# Patient Record
Sex: Female | Born: 1960 | Race: White | Hispanic: No | Marital: Married | Smoking: Never smoker
Health system: Southern US, Community
[De-identification: ages and names within clinical notes are randomized; demographics above are authoritative.]

## PROBLEM LIST (undated history)

## (undated) DIAGNOSIS — E042 Nontoxic multinodular goiter: Secondary | ICD-10-CM

## (undated) DIAGNOSIS — E039 Hypothyroidism, unspecified: Secondary | ICD-10-CM

## (undated) DIAGNOSIS — M8580 Other specified disorders of bone density and structure, unspecified site: Secondary | ICD-10-CM

## (undated) DIAGNOSIS — C44310 Basal cell carcinoma of skin of unspecified parts of face: Secondary | ICD-10-CM

## (undated) DIAGNOSIS — F411 Generalized anxiety disorder: Secondary | ICD-10-CM

## (undated) HISTORY — PX: MOHS SURGERY: SHX181

## (undated) HISTORY — DX: Generalized anxiety disorder: F41.1

## (undated) HISTORY — DX: Basal cell carcinoma of skin of unspecified parts of face: C44.310

## (undated) HISTORY — PX: SKIN SURGERY: SHX2413

## (undated) HISTORY — DX: Other specified disorders of bone density and structure, unspecified site: M85.80

## (undated) HISTORY — DX: Nontoxic multinodular goiter: E04.2

## (undated) HISTORY — DX: Hypothyroidism, unspecified: E03.9

---

## 1998-08-09 ENCOUNTER — Other Ambulatory Visit: Admission: RE | Admit: 1998-08-09 | Discharge: 1998-08-09 | Payer: Self-pay | Admitting: *Deleted

## 1999-06-25 ENCOUNTER — Other Ambulatory Visit: Admission: RE | Admit: 1999-06-25 | Discharge: 1999-06-25 | Payer: Self-pay | Admitting: Obstetrics & Gynecology

## 1999-12-22 ENCOUNTER — Inpatient Hospital Stay (HOSPITAL_COMMUNITY): Admission: AD | Admit: 1999-12-22 | Discharge: 1999-12-24 | Payer: Self-pay | Admitting: Obstetrics & Gynecology

## 1999-12-27 ENCOUNTER — Encounter: Admission: RE | Admit: 1999-12-27 | Discharge: 2000-03-26 | Payer: Self-pay | Admitting: Obstetrics & Gynecology

## 2000-02-12 ENCOUNTER — Other Ambulatory Visit: Admission: RE | Admit: 2000-02-12 | Discharge: 2000-02-12 | Payer: Self-pay | Admitting: Obstetrics & Gynecology

## 2000-03-28 ENCOUNTER — Encounter: Admission: RE | Admit: 2000-03-28 | Discharge: 2000-04-01 | Payer: Self-pay | Admitting: Obstetrics & Gynecology

## 2001-02-15 ENCOUNTER — Other Ambulatory Visit: Admission: RE | Admit: 2001-02-15 | Discharge: 2001-02-15 | Payer: Self-pay | Admitting: Obstetrics & Gynecology

## 2002-03-30 ENCOUNTER — Other Ambulatory Visit: Admission: RE | Admit: 2002-03-30 | Discharge: 2002-03-30 | Payer: Self-pay | Admitting: Obstetrics & Gynecology

## 2003-04-09 ENCOUNTER — Other Ambulatory Visit: Admission: RE | Admit: 2003-04-09 | Discharge: 2003-04-09 | Payer: Self-pay | Admitting: Obstetrics & Gynecology

## 2003-04-13 ENCOUNTER — Encounter: Admission: RE | Admit: 2003-04-13 | Discharge: 2003-04-13 | Payer: Self-pay | Admitting: Family Medicine

## 2003-05-31 ENCOUNTER — Ambulatory Visit (HOSPITAL_COMMUNITY): Admission: RE | Admit: 2003-05-31 | Discharge: 2003-05-31 | Payer: Self-pay | Admitting: Endocrinology

## 2003-05-31 ENCOUNTER — Encounter (INDEPENDENT_AMBULATORY_CARE_PROVIDER_SITE_OTHER): Payer: Self-pay | Admitting: *Deleted

## 2004-04-07 ENCOUNTER — Encounter: Admission: RE | Admit: 2004-04-07 | Discharge: 2004-04-07 | Payer: Self-pay | Admitting: Endocrinology

## 2004-04-30 ENCOUNTER — Other Ambulatory Visit: Admission: RE | Admit: 2004-04-30 | Discharge: 2004-04-30 | Payer: Self-pay | Admitting: Obstetrics & Gynecology

## 2005-05-07 ENCOUNTER — Other Ambulatory Visit: Admission: RE | Admit: 2005-05-07 | Discharge: 2005-05-07 | Payer: Self-pay | Admitting: Obstetrics & Gynecology

## 2005-07-21 ENCOUNTER — Encounter: Admission: RE | Admit: 2005-07-21 | Discharge: 2005-07-21 | Payer: Self-pay | Admitting: Family Medicine

## 2008-05-30 ENCOUNTER — Encounter: Admission: RE | Admit: 2008-05-30 | Discharge: 2008-05-30 | Payer: Self-pay | Admitting: Endocrinology

## 2008-07-12 ENCOUNTER — Other Ambulatory Visit: Admission: RE | Admit: 2008-07-12 | Discharge: 2008-07-12 | Payer: Self-pay | Admitting: Interventional Radiology

## 2008-07-12 ENCOUNTER — Encounter: Admission: RE | Admit: 2008-07-12 | Discharge: 2008-07-12 | Payer: Self-pay | Admitting: Endocrinology

## 2008-07-12 ENCOUNTER — Encounter (INDEPENDENT_AMBULATORY_CARE_PROVIDER_SITE_OTHER): Payer: Self-pay | Admitting: Interventional Radiology

## 2010-04-28 LAB — HM COLONOSCOPY

## 2010-06-29 ENCOUNTER — Encounter: Payer: Self-pay | Admitting: Endocrinology

## 2010-11-12 ENCOUNTER — Other Ambulatory Visit: Payer: Self-pay | Admitting: Endocrinology

## 2010-11-12 DIAGNOSIS — E049 Nontoxic goiter, unspecified: Secondary | ICD-10-CM

## 2010-12-02 ENCOUNTER — Ambulatory Visit
Admission: RE | Admit: 2010-12-02 | Discharge: 2010-12-02 | Disposition: A | Discharge status: Home / Self Care | Payer: BC Managed Care – PPO | Source: Ambulatory Visit | Attending: Endocrinology | Admitting: Endocrinology

## 2010-12-02 DIAGNOSIS — E049 Nontoxic goiter, unspecified: Secondary | ICD-10-CM

## 2011-09-07 ENCOUNTER — Ambulatory Visit (INDEPENDENT_AMBULATORY_CARE_PROVIDER_SITE_OTHER): Payer: BC Managed Care – PPO | Admitting: Family Medicine

## 2011-09-07 ENCOUNTER — Encounter: Payer: Self-pay | Admitting: Family Medicine

## 2011-09-07 VITALS — BP 100/62 | HR 68 | Ht 65.0 in | Wt 134.0 lb

## 2011-09-07 DIAGNOSIS — M545 Low back pain, unspecified: Secondary | ICD-10-CM

## 2011-09-07 DIAGNOSIS — F411 Generalized anxiety disorder: Secondary | ICD-10-CM

## 2011-09-07 LAB — POCT URINALYSIS DIPSTICK
Bilirubin, UA: NEGATIVE
Blood, UA: NEGATIVE
Glucose, UA: NEGATIVE
Ketones, UA: NEGATIVE
Leukocytes, UA: NEGATIVE
Nitrite, UA: NEGATIVE
Protein, UA: NEGATIVE
Spec Grav, UA: <=1.005
Urobilinogen, UA: NEGATIVE
pH, UA: 8

## 2011-09-07 MED ORDER — NAPROXEN 500 MG PO TABS: 500.0000 mg | ORAL_TABLET | Freq: Two times a day (BID) | ORAL | Status: AC

## 2011-09-07 NOTE — Progress Notes (Signed)
Chief complaint:  Low back pain x 3 months. Lower back pain when she turns a certain way. UA neg.   HPI:  Started boot camp last May, going regularly 2x/week.  Sometimes her back seems irritated more after doing the boot camp.  Pain is at her R lower back.  Has pain when she rolls over in bed at night, or when she twists to the right.  Described as an irritating pain.  Has more pain when she's really tired at the end of the day, achey.  Denies any radiation of pain, denies numbness/ or tingling.  Denies any pain in spine.  Tried Motrin and Aleve, some improvement, only took sporadically. No history of previous back injury or problems.  Anxiety--has been on the citalopram for years, and hasn't had any problems.    Past Medical History  Diagnosis Date  . Hypothyroidism     followed by Dr. Talmage Nap  . Generalized anxiety disorder     History reviewed. No pertinent past surgical history.  History   Social History  . Marital Status: Married    Spouse Name: N/A    Number of Children: 1  . Years of Education: N/A   Occupational History  . teacher [employer]   Social History Main Topics  . Smoking status: Never Smoker   . Smokeless tobacco: Never Used  . Alcohol Use: Yes     0-1 per week.  . Drug Use: Not on file  . Sexually Active: Not on file   Other Topics Concern  . 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 Lives with her husband and son; dog 2 cats    Family History  Problem Relation Age of Onset  . Hypothyroidism Mother   . Arthritis Mother   . Hyperlipidemia Father   . Arthritis Sister   . Cancer Maternal Grandmother     breast cancer in 50's  . Cancer Maternal Grandfather     leukemia, diagnosed in 38's  . Diabetes Maternal Grandfather   . Heart disease Paternal Grandfather     37's    Current outpatient prescriptions:citalopram (CELEXA) 20 MG tablet, , Disp: , Rfl: ;  GIANVI 3-0.02 MG tablet, , Disp: ,  Rfl: ;  levothyroxine (SYNTHROID, LEVOTHROID) 100 MCG tablet, , Disp: , Rfl: ;  naproxen (NAPROSYN) 500 MG tablet, Take 1 tablet (500 mg total) by mouth 2 (two) times daily with a meal. Take as needed for pain, Disp: 30 tablet, Rfl: 0  Allergies  Allergen Reactions  . Codeine Other (See Comments)    Heart racing/palpitations.   ROS:  Denies fevers, URI symptoms, urinary complaints, GI complaints, skin rashes, chest pain, palpitations, edema, numbness, tingling, weakness, other joint pains or other concerns.  Fingerstick cholesterol at a screen (nonfasting) was 215.  Has lipids checked by GYN and they were reportedly normal.  PHYSICAL EXAM: BP 100/62  Pulse 68  Ht 5\' 5"  (1.651 m)  Wt 134 lb (60.782 kg)  BMI 22.30 kg/m2  LMP 08/24/2011 Well developed, pleasant female in no distress Neck: no lymphadenopathy or thyromegaly Heart: regular rate and rhythm Lungs: clear Back: no spine or CVA tenderness.  Tender at R SI joint.  No hypermobility.  No muscle spasm.  Negative straight leg raise Neuro: alert and oriented.  Cranial nerves grossly intact.  DTR's 2+ and symmetric in upper and lower extremities.  Normal sensation and strength Skin: no rash Psych: normal mood, affect, hygiene and  grooming  ASSESSMENT/PLAN: 1. LBP (low back pain)  POCT Urinalysis Dipstick, naproxen (NAPROSYN) 500 MG tablet  2. Anxiety state, unspecified     Pain at R SI joint, but no significant dysfunction or muscle spasm noted. Naprosyn, heat, stretches.  Consider chiro if not improving (or PT).  Anxiety--consider trial of cutting dose of celexa to 1/2 tablet daily for a month or two, and if still doing well, can taper off completely.  If recurrent symptoms, can increase back to full tablet, and continue to take longterm.

## 2011-09-07 NOTE — Patient Instructions (Signed)
Heat, stretches. Take anti-inflammatory twice daily with food until pain is gone completely.  If you take the full 2 weeks, and continue to have pain, consider physical therapy or chiropractor (I see Dr. Thereasa Distance at [facility] on [facility], or other chiropractor who was Active Release Technique (ART))

## 2011-09-23 ENCOUNTER — Encounter: Payer: Self-pay | Admitting: Family Medicine

## 2011-09-24 ENCOUNTER — Encounter: Payer: Self-pay | Admitting: Family Medicine

## 2011-09-24 ENCOUNTER — Ambulatory Visit (INDEPENDENT_AMBULATORY_CARE_PROVIDER_SITE_OTHER): Payer: BC Managed Care – PPO | Admitting: Family Medicine

## 2011-09-24 VITALS — BP 94/60 | HR 64 | Temp 98.5°F | Ht 65.0 in | Wt 131.0 lb

## 2011-09-24 DIAGNOSIS — R197 Diarrhea, unspecified: Secondary | ICD-10-CM

## 2011-09-24 NOTE — Patient Instructions (Signed)
Drink plenty of fluids (non-dairy).   B.R.A.T. Diet Your doctor has recommended the B.R.A.T. diet for you or your child until the condition improves. This is often used to help control diarrhea and vomiting symptoms. If you or your child can tolerate clear liquids, you may have:  Bananas.   Rice.   Applesauce.   Toast (and other simple starches such as crackers, potatoes, noodles).  Be sure to avoid dairy products, meats, and fatty foods until symptoms are better. Fruit juices such as apple, grape, and prune juice can make diarrhea worse. Avoid these. Continue this diet for 2 days or as instructed by your caregiver. Document Released: 05/25/2005 Document Revised: 05/14/2011 Document Reviewed: 11/11/2006 [facility] Patient Information 2012 [place], [place].  You can advance your diet to a bland diet, if stools are improving and feeling better.  Avoid dairy for about a week, allowing your body time to regenerate the lactase enzyme to help digest dairy products.  You can continue to use Pepto Bismol if helping (will cause stools to turn black) versus Imodium.  Probiotics (ie Align, or Activia) can help reset the normal colonic flora.  Return immediately if having high fevers, severe abdominal pain, bloody stools, or persistent black bowel movement when you have not taken Pepto.    Avoid anti-inflammatories for now--use tylenol instead.

## 2011-09-24 NOTE — Progress Notes (Signed)
Chief complaint: diarrhea since Tues morning. Tues 2x in the am and once in the afternoon. Wed,Thurs only in the am but nauseous thoroughout the day. Stools were black. She has not attempted to eat anything today  HPI: 2 days ago, in the morning had gurgling stomach followed by 2 episodes of completely watery stool, brown.  Took Pepto Bismol later that night, helped some, and the following day, as well as today, stools were black.  Stools are still very watery, but a little more formed than the first day.  Cramping right before going to bathroom, otherwise no abdominal pain. Had nausea for the first 2 days, a little better today.  Denies fevers, blood in stool.  Denies sick contacts at home, some ill contacts at school.  Denies eating any spoiled or undercooked foods, eating same foods as others who are not ill, no travel, no cramping, no recent antibiotic use.  She was seen 4/1 with LBP, and rx'd naproxen.  Took it for 10 days.  Took it with food.  Seemed to have some indigestion with taking it (even with food), so stopped it after 10 days, and indigestion resolved. Back pain isn't back to normal, but is improved.  Pain has moved more to lateral/anterior hip now.  Past Medical History  Diagnosis Date  . Hypothyroidism     followed by Dr. Talmage Nap  . Generalized anxiety disorder   . Multinodular goiter     Dr. Talmage Nap    No past surgical history on file.  History   Social History  . Marital Status: Married    Spouse Name: N/A    Number of Children: 1  . Years of Education: N/A   Occupational History  . teacher [employer]   Social History Main Topics  . Smoking status: Never Smoker   . Smokeless tobacco: Never Used  . Alcohol Use: Yes     0-1 per week.  . Drug Use: Not on file  . Sexually Active: Not on file   Other Topics Concern  . 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 Hypothyroidism Mother   . Arthritis Mother   . Hyperlipidemia Father   . Arthritis Sister   . Cancer Maternal Grandmother     breast cancer in 67's  . Cancer Maternal Grandfather     leukemia, diagnosed in 20's  . Diabetes Maternal Grandfather   . Heart disease Paternal Grandfather     4's    Current outpatient prescriptions:citalopram (CELEXA) 20 MG tablet, , Disp: , Rfl: ;  GIANVI 3-0.02 MG tablet, , Disp: , Rfl: ;  levothyroxine (SYNTHROID, LEVOTHROID) 100 MCG tablet, , Disp: , Rfl: ;  naproxen (NAPROSYN) 500 MG tablet, Take 1 tablet (500 mg total) by mouth 2 (two) times daily with a meal. Take as needed for pain, Disp: 30 tablet, Rfl: 0  Allergies  Allergen Reactions  . Codeine Other (See Comments)    Heart racing/palpitations.   ROS:  Denies fevers, headaches, dizziness, abdominal pain (see HPI), hematochezia, urinary complaints or other problems  PHYSICAL EXAM: BP 94/60  Pulse 64  Temp(Src) 98.5 F (36.9 C) (Oral)  Ht 5\' 5"  (1.651 m)  Wt 131 lb (59.421 kg)  BMI 21.80 kg/m2  LMP 08/24/2011 Well developed, pleasant female in no distress Heart: regular rate and rhythm Lungs:  clear Abdomen: hyperactive bowel sounds, nontender Extremities: no edema Back: no spinal tenderness, very mild discomfort at R SI joint  Declines rectal exam/hemoccult today.  ASSESSMENT/PLAN: 1. Diarrhea   Most likely viral etiology given history.  Reassured that stools are likely black from pepto bismol rather than from bleeding ulcer, given her history.  (she refused rectal exam to reassure her further with a negative hemoccult).  Discussed BRAT diet, avoiding dairy, drinking plenty of fluids. Return if increasing pain, fever, ongoing diarrhea, bloody stools, abdominal pain, or other concerns  Avoid NSAIDs for now

## 2012-04-26 ENCOUNTER — Other Ambulatory Visit: Payer: Self-pay | Admitting: Endocrinology

## 2012-04-26 DIAGNOSIS — E049 Nontoxic goiter, unspecified: Secondary | ICD-10-CM

## 2012-05-12 ENCOUNTER — Other Ambulatory Visit: Payer: BC Managed Care – PPO

## 2012-05-13 ENCOUNTER — Ambulatory Visit
Admission: RE | Admit: 2012-05-13 | Discharge: 2012-05-13 | Disposition: A | Discharge status: Home / Self Care | Payer: BC Managed Care – PPO | Source: Ambulatory Visit | Attending: Endocrinology | Admitting: Endocrinology

## 2012-05-13 DIAGNOSIS — E049 Nontoxic goiter, unspecified: Secondary | ICD-10-CM

## 2012-06-10 ENCOUNTER — Encounter: Payer: Self-pay | Admitting: Medical

## 2012-06-10 ENCOUNTER — Ambulatory Visit (INDEPENDENT_AMBULATORY_CARE_PROVIDER_SITE_OTHER): Payer: BC Managed Care – PPO | Admitting: Medical

## 2012-06-10 VITALS — BP 110/70 | HR 62 | Temp 98.3°F | Resp 16 | Wt 145.0 lb

## 2012-06-10 DIAGNOSIS — H9201 Otalgia, right ear: Secondary | ICD-10-CM

## 2012-06-10 DIAGNOSIS — H9209 Otalgia, unspecified ear: Secondary | ICD-10-CM

## 2012-06-10 DIAGNOSIS — H6980 Other specified disorders of Eustachian tube, unspecified ear: Secondary | ICD-10-CM

## 2012-06-10 MED ORDER — MOMETASONE FUROATE 50 MCG/ACT NA SUSP: 2.0000 | Freq: Every day | NASAL | Status: DC

## 2012-06-10 NOTE — Progress Notes (Signed)
Subjective: Here for right ear c/o.  Having some right ear pressure, dull ache, headache x 1 week.  Some head congestion, but denies fever, runny nose, sneezing, cough, nausea, vomiting, dizziness.  No recent flying, diving, change in elevation.   Using nothing for symptoms.  No other aggravating or relieving factors.  No other symptoms or c/o.  Past Medical History  Diagnosis Date  . Hypothyroidism     followed by Dr. Talmage Nap  . Generalized anxiety disorder   . Multinodular goiter     Dr. Talmage Nap   Review of Systems Constitutional: -fever, -chills, -sweats, -fatigue ENT: -runny nose,  -sore throat Respiratory: -cough, -shortness of breath, -wheezing Gastroenterology: -abdominal pain, -nausea, -vomiting, -diarrhea Ophthalmology: -vision changes Neurology: -weakness, -tingling, -numbness     Objective:   Physical Exam  Filed Vitals:   06/10/12 0905  BP: 110/70  Pulse: 62  Temp: 98.3 F (36.8 C)  Resp: 16    General appearance: alert, no distress, WD/WN HEENT: normocephalic, conjunctiva normal, slightly decreased light reflex, but otherwise normal appearing TMs, nares patent, no discharge or erythema, pharynx normal Oral cavity: MMM, no lesions Neck: supple, no lymphadenopathy, no thyromegaly, no masses Lungs: CTA bilaterally, no wheezes, rhonchi, or rales Neuro: CN2-12 intact, gross hearing normal   Assessment and Plan :    Encounter Diagnoses  Name Primary?  . Eustachian tube dysfunction Yes  . Otalgia of right ear    Advised that most likely etiology is eustachian tube dysfunction.   Advised 1-2 wk trial of Nasonex samples 1 spray per nostril BID, increased water intake, and/or Zyrtec QHS.  If not improved or worse in the next 2 wk, then recheck.

## 2012-06-10 NOTE — Patient Instructions (Signed)
Increase water intake.  Begin nasonex nasal spray, 1 spray per nostril morning and night time for a week or 2.  Also consider Zyrtec at bedtime for the same period of time.   If this doesn't resolve in 2 weeks, then recheck.   Barotitis Media Barotitis media is soreness (inflammation) of the area behind the eardrum (middle ear). This occurs when the auditory tube (Eustachian tube) leading from the back of the throat to the eardrum is blocked. When it is blocked air cannot move in and out of the middle ear to equalize pressure changes. These pressure changes come from changes in altitude when:  Flying.   Driving in the mountains.   Diving.  Problems are more likely to occur with pressure changes during times when you are congested as from:  Hay fever.   Upper respiratory infection.   A cold.  Damage or hearing loss (barotrauma) caused by this may be permanent. HOME CARE INSTRUCTIONS   Use medicines as recommended by your caregiver. Over the counter medicines will help unblock the canal and can help during times of air travel.   Do not put anything into your ears to clean or unplug them. Eardrops will not be helpful.   Do not swim, dive, or fly until your caregiver says it is all right to do so. If these activities are necessary, chewing gum with frequent swallowing may help. It is also helpful to hold your nose and gently blow to pop your ears for equalizing pressure changes. This forces air into the Eustachian tube.   For little ones with problems, give your baby a bottle of water or juice during periods when pressure changes would be anticipated such as during take offs and landings associated with air travel.   Only take over-the-counter or prescription medicines for pain, discomfort, or fever as directed by your caregiver.   A decongestant may be helpful in de-congesting the middle ear and make pressure equalization easier. This can be even more effective if the drops (spray) are  delivered with the head lying over the edge of a bed with the head tilted toward the ear on the affected side.   If your caregiver has given you a follow-up appointment, it is very important to keep that appointment. Not keeping the appointment could result in a chronic or permanent injury, pain, hearing loss and disability. If there is any problem keeping the appointment, you must call back to this facility for assistance.  SEEK IMMEDIATE MEDICAL CARE IF:   You develop a severe headache, dizziness, severe ear pain, or bloody or pus-like drainage from your ears.   An oral temperature above 102 F (38.9 C) develops.   Your problems do not improve or become worse.  MAKE SURE YOU:   Understand these instructions.   Will watch your condition.   Will get help right away if you are not doing well or get worse.  Document Released: 05/22/2000 Document Revised: 02/04/2011 Document Reviewed: 12/29/2007 [facility] Patient Information 2012 [place], [place].

## 2013-07-04 ENCOUNTER — Other Ambulatory Visit: Payer: Self-pay | Admitting: Endocrinology

## 2013-07-04 DIAGNOSIS — E039 Hypothyroidism, unspecified: Secondary | ICD-10-CM

## 2013-07-31 ENCOUNTER — Other Ambulatory Visit: Payer: BC Managed Care – PPO

## 2013-08-02 ENCOUNTER — Ambulatory Visit
Admission: RE | Admit: 2013-08-02 | Discharge: 2013-08-02 | Disposition: A | Discharge status: Home / Self Care | Payer: BC Managed Care – PPO | Source: Ambulatory Visit | Attending: Endocrinology | Admitting: Endocrinology

## 2013-08-02 DIAGNOSIS — E039 Hypothyroidism, unspecified: Secondary | ICD-10-CM

## 2013-08-16 ENCOUNTER — Ambulatory Visit: Payer: Self-pay | Admitting: Family Medicine

## 2014-02-15 ENCOUNTER — Other Ambulatory Visit: Payer: Self-pay | Admitting: Endocrinology

## 2014-02-15 DIAGNOSIS — E049 Nontoxic goiter, unspecified: Secondary | ICD-10-CM

## 2014-02-28 ENCOUNTER — Other Ambulatory Visit: Payer: BC Managed Care – PPO

## 2014-07-19 ENCOUNTER — Other Ambulatory Visit: Payer: Self-pay | Admitting: Orthopedic Surgery

## 2014-07-19 DIAGNOSIS — M25561 Pain in right knee: Secondary | ICD-10-CM

## 2014-07-26 ENCOUNTER — Ambulatory Visit
Admission: RE | Admit: 2014-07-26 | Discharge: 2014-07-26 | Disposition: A | Discharge status: Home / Self Care | Payer: BC Managed Care – PPO | Source: Ambulatory Visit | Attending: Orthopedic Surgery | Admitting: Orthopedic Surgery

## 2014-07-26 DIAGNOSIS — M25561 Pain in right knee: Secondary | ICD-10-CM

## 2014-08-22 ENCOUNTER — Other Ambulatory Visit: Payer: Self-pay | Admitting: Endocrinology

## 2014-08-22 DIAGNOSIS — E049 Nontoxic goiter, unspecified: Secondary | ICD-10-CM

## 2014-08-28 ENCOUNTER — Ambulatory Visit
Admission: RE | Admit: 2014-08-28 | Discharge: 2014-08-28 | Disposition: A | Discharge status: Home / Self Care | Payer: BC Managed Care – PPO | Source: Ambulatory Visit | Attending: Endocrinology | Admitting: Endocrinology

## 2014-08-28 DIAGNOSIS — E049 Nontoxic goiter, unspecified: Secondary | ICD-10-CM

## 2014-12-17 ENCOUNTER — Other Ambulatory Visit: Payer: Self-pay | Admitting: Obstetrics & Gynecology

## 2014-12-18 LAB — CYTOLOGY - PAP

## 2015-09-07 DIAGNOSIS — C44310 Basal cell carcinoma of skin of unspecified parts of face: Secondary | ICD-10-CM

## 2015-09-07 HISTORY — DX: Basal cell carcinoma of skin of unspecified parts of face: C44.310

## 2016-02-12 ENCOUNTER — Encounter: Payer: Self-pay | Admitting: Family Medicine

## 2016-02-12 ENCOUNTER — Ambulatory Visit (INDEPENDENT_AMBULATORY_CARE_PROVIDER_SITE_OTHER): Payer: BC Managed Care – PPO | Admitting: Family Medicine

## 2016-02-12 VITALS — BP 100/58 | HR 80 | Temp 97.6°F | Ht 65.0 in | Wt 138.6 lb

## 2016-02-12 DIAGNOSIS — J309 Allergic rhinitis, unspecified: Secondary | ICD-10-CM | POA: Diagnosis not present

## 2016-02-12 MED ORDER — FLUTICASONE PROPIONATE 50 MCG/ACT NA SUSP: 2.0000 | Freq: Every day | NASAL | 6 refills | Status: DC

## 2016-02-12 NOTE — Patient Instructions (Signed)
  Drink plenty of water to keep secretions thin. Restart Flonase (2 sprays each nostril daily, gentle sniffs only, and may be decreased to just 1 spray per day for maintenance if/when doing well). You can also use an antihistamine such as claritin or allegra or zyrtec once daily while you are waiting for the flonase to be effective. Consider doing sinus rinses as needed for sinus pain. Continue the decongestants as needed. Consider adding guaifenesin (ie Mucinex) if secretions are very thick.  Contact us if your secretions turn yellow-green, if you develop fevers, or other new symptoms. If you pick up a cold from the students, you may see this discoloration--continue over-the-counter supportive measures, and contact us if it isn't improving after 5-7 days.

## 2016-02-12 NOTE — Progress Notes (Signed)
Chief Complaint  Patient presents with  . Sinusitis    sinus headache, not really sinus pain. The headaches are sinus relates. Does not have any discolored mucus. 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     She is complaining of headaches x several months that she feels are sinus related. Pain is in her forehead, above her eyes, and sometimes it feels like a vice squeezing, which she realizes may be stress/muscular.  She has developed increased sinus pressure since school started (2 weeks). She had mold in her classroom, which has now been resolved.  No fevers, chills. This week her nose started running, but hasn't really had a lot of mucus or congestion.  Denies sore throat, postnasal drainage, cough.  Tried an OTC decongestant, 12-hour taking in the mornings, which helps some, but headache recurs in the afternoon (prior to 12 hours). Sometimes she will take another when she gets home, which helps, but causes some insomnia.  Previously used Flonase with good results.  Uses it seasonally, not in a while.  PMH, PSH, SH reviewed  Outpatient Encounter Prescriptions as of 02/12/2016  Medication Sig  . citalopram (CELEXA) 20 MG tablet   . levothyroxine (SYNTHROID, LEVOTHROID) 100 MCG tablet   . acetaminophen (TYLENOL) 325 MG tablet Take 650 mg by mouth every 6 (six) hours as needed.  . fluticasone (FLONASE) 50 MCG/ACT nasal spray Place 2 sprays into both nostrils daily.  . [DISCONTINUED] GIANVI 3-0.02 MG tablet   . [DISCONTINUED] mometasone (NASONEX) 50 MCG/ACT nasal spray Place 2 sprays into the nose daily.   No facility-administered encounter medications on file as of 02/12/2016.    Allergies  Allergen Reactions  . Codeine Other (See Comments)    Heart racing/palpitations.   ROS:  No fever, chills, myalgias, dizziness, ear pain, sore throat, cough, shortness of breath, chest pain, bleeding, bruising, rash, hearing/vision change, numbness, tingling or other concerns. See  HPI.  PHYSICAL EXAM:  BP (!) 100/58 (BP Location: Left Arm, Patient Position: Sitting, Cuff Size: Normal)   Pulse 80   Temp 97.6 F (36.4 C) (Tympanic)   Ht 5\' 5"  (1.651 m)   Wt 138 lb 9.6 oz (62.9 kg)   BMI 23.06 kg/m   Well developed, well-appearing female in no distress HEENT: PERRL, EOMI, conjunctiva and sclera are clear. Nasal mucosa was mild-mod edematous L>R, without erythema or purulence. Sinuses nontender OP clear Neck: no lymphadenopathy or mass Heart: regular rate and rhythm without murmur Lungs: clear bilaterally Skin: normal turgor, no rash Neuro: alert and oriented, cranial nerves intact, normal gait Psych: normal mood, affect, hygiene and grooming  ASSESSMENT/PLAN:  Allergic rhinitis, unspecified allergic rhinitis type - no evidence of bacterial infection. Supportive measures reviewed.  Restart flonase, continue decongestants prn, antihistamines prn - Plan: fluticasone (FLONASE) 50 MCG/ACT nasal spray   Drink plenty of water to keep secretions thin. Restart Flonase (2 sprays each nostril daily, gentle sniffs only, and may be decreased to just 1 spray per day for maintenance if/when doing well). You can also use an antihistamine such as claritin or allegra or zyrtec once daily while you are waiting for the flonase to be effective. Consider doing sinus rinses as needed for sinus pain. Continue the decongestants as needed. Consider adding guaifenesin (ie Mucinex) if secretions are very thick.  Contact us if your secretions turn yellow-green, if you develop fevers, or other new symptoms. 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

## 2016-06-04 ENCOUNTER — Other Ambulatory Visit: Payer: Self-pay | Admitting: Endocrinology

## 2016-06-04 DIAGNOSIS — E049 Nontoxic goiter, unspecified: Secondary | ICD-10-CM

## 2016-07-27 ENCOUNTER — Encounter: Payer: Self-pay | Admitting: Family Medicine

## 2016-07-27 ENCOUNTER — Ambulatory Visit (INDEPENDENT_AMBULATORY_CARE_PROVIDER_SITE_OTHER): Payer: BC Managed Care – PPO | Admitting: Family Medicine

## 2016-07-27 VITALS — BP 120/70 | HR 68 | Ht 65.0 in | Wt 145.0 lb

## 2016-07-27 DIAGNOSIS — R519 Headache, unspecified: Secondary | ICD-10-CM

## 2016-07-27 DIAGNOSIS — G4482 Headache associated with sexual activity: Secondary | ICD-10-CM | POA: Diagnosis not present

## 2016-07-27 DIAGNOSIS — R51 Headache: Secondary | ICD-10-CM | POA: Diagnosis not present

## 2016-07-27 NOTE — Progress Notes (Signed)
Chief Complaint  Patient presents with  . Migraine    having really bad headaches (on the sides of her head), ears ringing. More often than usual. Almost everyday off and on. Sometimes to the point of making her sick to her stomach. Feels like her heart is racing at night when she sits down on the couch.    Currently has a frontal headache, and has noted some slight ringing in her ears.  Admits she hasn't been using nasal spray daily.  She was last seen in September with sinus headaches.  She would still have some intermittent headaches, but better when using flonase and sudafed.  2 weeks she woke up with a severe headache at both temples, associated with some nausea.  She has been having frequent headaches since then--not daily, but more often than usual. Most of her headaches have been at her temples bilaterally.  She made the appointment today after an incident 2 days ago-- She reports that during an intimate moment on Saturday (during orgasm)--she felt like her head was being stabbed with knives. She describes it as the worst headache of her life.  It eased off after orgasm, but had some residual headache. It ultimately completely resolved.  She had no associated numbness, tingling, weakness or other neurologic symptoms, just the headache.  No known teeth clenching or grinding (husband/dentist haven't mentioned).  Chews some gum, not daily. Some stress related to 16yo son Denies snoring/apnea.  She is postmenopausal. Sees Dr. Nori Riis, who she says is encouraging her to get on hormones. She is hesitant, and would like my opinion.  She denies any hot flashes, night sweats, insomnia, vaginal dryness/pain with intercourse or other significant menopausal symptoms.  Citalopram is effective in treating anxiety. She thinks she may have missed taking the citalopram over the weekend (?really wasn't sure, possibly could have forgotten)  PMH, PSH, SH reviewed  Outpatient Encounter Prescriptions as of  07/27/2016  Medication Sig  . citalopram (CELEXA) 20 MG tablet Take 20 mg by mouth daily.   . fluticasone (FLONASE) 50 MCG/ACT nasal spray Place 2 sprays into both nostrils daily.  Marland Kitchen levothyroxine (SYNTHROID, LEVOTHROID) 100 MCG tablet Take 100 mcg by mouth daily before breakfast.   . acetaminophen (TYLENOL) 325 MG tablet Take 650 mg by mouth every 6 (six) hours as needed.  . phenylephrine (SUDAFED PE) 10 MG TABS tablet Take 10 mg by mouth every 4 (four) hours as needed.   No facility-administered encounter medications on file as of 07/27/2016.    Allergies  Allergen Reactions  . Codeine Other (See Comments)    Heart racing/palpitations.   ROS: no fever, chills, URI symptoms; has some sinus congestion and +headaches as per HPI. No cough, shortness of breath, vomiting, diarrhea, bleeding, bruising, numbness, tingling, weakness, trouble with speech, memory, tremor, vertigo or other dizziness, or other concerns.  See HPI   PHYSICAL EXAM:  BP 120/70 (BP Location: Left Arm, Patient Position: Sitting, Cuff Size: Normal)   Pulse 68   Ht 5\' 5"  (1.651 m)   Wt 145 lb (65.8 kg)   BMI 24.13 kg/m    Pleasant, well-appearing female in no distress HEENT: PERRL, EOMI, conjunctiva and sclera are clear. Fundi benign. TM's and EACs normal.  Nasal mucosa--Mod edema L>R, no erythema or purulence.  Nontender sinuses and temporalis muscles, temporal arteries, and TMJ's. OP is clear Neck: no lymphadenopathy.  +Thyroid nodules on right Heart: regular rate and rhythm without murmur Lungs: clear bilaterally Abdomen: soft, nontender, no mass Extremities: no  edema Neuro: alert and oriented, cranial nerves 2-12 intact.  DTR's 2+ and symmetric. Normal strength, sensation, finger to nose, gait. Psych: normal mood, affect, hygiene and grooming  ASSESSMENT/PLAN:  Temporal headache - Ddx reviewed; currently without headache, nontender. Poss MSK--NSAID trial if MRI/MRA neg - Plan: MR MRA Head/Brain Wo Cm  Sinus  headache - encouraged daily inhaled steroid use; sudafed prn sinus headache. no evidence of infection  Orgasmic headache - Need to r/o intracranial pathology, given "worst headache of her life" during orgasm--SAH, aneurysm, dissection or other abnl.  - Plan: MR MRA Head/Brain Wo Cm  Headaches: Likely benign, but check MRI/MRA to exclude intracranial pathology (aneurysm, bleed, dissection).  Normal neuro exam.  Discussed HRT--not recommended given lack of symptoms, good QOL.  Risks/benefits reviewed.Marland Kitchen

## 2016-07-27 NOTE — Patient Instructions (Signed)
We are referring you for MRI/MRA of the brain. Call 911 or go to ER if worsening headaches with any associated neurologic symptoms (slurred speech, weakness, numbness, tingling, etc).  Some of your headaches are sinus-use the flonase daily, sudafed prn.

## 2016-07-30 ENCOUNTER — Ambulatory Visit
Admission: RE | Admit: 2016-07-30 | Discharge: 2016-07-30 | Disposition: A | Discharge status: Home / Self Care | Payer: BC Managed Care – PPO | Source: Ambulatory Visit | Attending: Family Medicine | Admitting: Family Medicine

## 2016-07-30 ENCOUNTER — Other Ambulatory Visit: Payer: Self-pay | Admitting: *Deleted

## 2016-07-30 ENCOUNTER — Inpatient Hospital Stay: Admission: RE | Admit: 2016-07-30 | Payer: BC Managed Care – PPO | Source: Ambulatory Visit

## 2016-07-30 DIAGNOSIS — R519 Headache, unspecified: Secondary | ICD-10-CM

## 2016-07-30 DIAGNOSIS — R51 Headache: Principal | ICD-10-CM

## 2016-08-13 ENCOUNTER — Other Ambulatory Visit: Payer: BC Managed Care – PPO

## 2016-11-23 ENCOUNTER — Telehealth: Payer: Self-pay | Admitting: Family Medicine

## 2016-11-23 NOTE — Telephone Encounter (Signed)
Pt is coming in June the 20th 2018,

## 2016-11-23 NOTE — Telephone Encounter (Signed)
Pt called and and is going out of town to [place] and she is wanting to know if she needs any shots, she states she looked online and don't see that she needs anything, the only thing she can think that she needs is a tetanus shot, states it has been at least 17 years since she has had one, she would like to know what you suggest, pt can be reached at [phone]

## 2016-11-23 NOTE — Telephone Encounter (Signed)
She hasn't been here for a physical, so we have no documentation of any vaccines.  There are some other things that may be recommended depending on where she is going (how rural, what she'll be doing, etc).  Offer OV and we can give Tdap at visit.

## 2016-11-25 ENCOUNTER — Ambulatory Visit (INDEPENDENT_AMBULATORY_CARE_PROVIDER_SITE_OTHER): Payer: BC Managed Care – PPO | Admitting: Family Medicine

## 2016-11-25 ENCOUNTER — Encounter: Payer: Self-pay | Admitting: Family Medicine

## 2016-11-25 VITALS — BP 104/64 | HR 80 | Ht 65.0 in | Wt 143.8 lb

## 2016-11-25 DIAGNOSIS — Z7184 Encounter for health counseling related to travel: Secondary | ICD-10-CM

## 2016-11-25 DIAGNOSIS — Z23 Encounter for immunization: Secondary | ICD-10-CM | POA: Diagnosis not present

## 2016-11-25 DIAGNOSIS — Z7189 Other specified counseling: Secondary | ICD-10-CM | POA: Diagnosis not present

## 2016-11-25 MED ORDER — TYPHOID VACCINE PO CPDR: 1.0000 | DELAYED_RELEASE_CAPSULE | ORAL | 0 refills | Status: DC

## 2016-11-25 NOTE — Progress Notes (Signed)
Chief Complaint  Patient presents with  . Advice Only    travelling to [place] (leaving July 7th)-consult for immunizations needed. Town in [place] is [place] or [place] [place].     Mission trip to [place] (an hour from [facility]). She leaves 7/6, going with her 57 yo son and her church.  [place], [place] a resort/vacation destination.  Her church has been going there for a while, and feels confident they will have precautions, bottled water.  She knows she is long past due for a tetanus shot.  She has never had Hepatitis A series.  The CDC clinician's information was reviewed in detail with the patient.  Discussed the optional typhoid and hepatitis A recommendations. Discussed that hepatitis A is recommended for other travel as well, and that the typhoid vaccination lasts for 5 years.   PMH, PSH, SH reviewed  Outpatient Encounter Prescriptions as of 11/25/2016  Medication Sig  . citalopram (CELEXA) 20 MG tablet Take 20 mg by mouth daily.   . fluticasone (FLONASE) 50 MCG/ACT nasal spray Place 2 sprays into both nostrils daily.  Marland Kitchen levothyroxine (SYNTHROID, LEVOTHROID) 88 MCG tablet Take 88 mcg by mouth daily before breakfast.   . acetaminophen (TYLENOL) 325 MG tablet Take 650 mg by mouth every 6 (six) hours as needed.  . phenylephrine (SUDAFED PE) 10 MG TABS tablet Take 10 mg by mouth every 4 (four) hours as needed.  . typhoid (VIVOTIF) DR capsule Take 1 capsule by mouth every other day.  . [DISCONTINUED] levothyroxine (SYNTHROID, LEVOTHROID) 100 MCG tablet Take 100 mcg by mouth daily before breakfast.    No facility-administered encounter medications on file as of 11/25/2016.    Allergies  Allergen Reactions  . Codeine Other (See Comments)    Heart racing/palpitations.   ROS: no fever, chills, URI symptoms or any complaints today.  PHYSICAL EXAM: BP 104/64 (BP Location: Left Arm, Patient Position: Sitting, Cuff Size: Normal)   Pulse 80   Ht 5\' 5"   (1.651 m)   Wt 143 lb 12.8 oz (65.2 kg)   BMI 23.93 kg/m   Well appearing, pleasant female in no distress  Educated as stated above regarding travel recommendations for [place], including discussion of insect repellant, sunscreen, safe/unsafe water sources, and shown where to find additional info on the [employer].  All questions were answered. Counseled re: risks/side effects of medications and vaccines. Recommended Shingrix--to check with insurance and return (likely will do when she comes for 2nd Hepatitis A vaccine).   TdaP Hep A #1 Vivotif Discussed shingrix

## 2016-11-25 NOTE — Patient Instructions (Addendum)
This is the website with good information for travel to [place] [email or web address]  Return in 6 months for the second hepatitis A vaccine.  Take the vivotif every other day an hour before a meal for 4 doses (don't take with hot water) for Typhoid prevention (this lasts for 5 years).  I recommend getting the new shingles vaccine (Shingrix). You will need to check with your insurance to see if it is covered.  It is a series of 2 injections, spaced 2 months apart.

## 2016-12-02 ENCOUNTER — Encounter: Payer: Self-pay | Admitting: Family Medicine

## 2016-12-31 ENCOUNTER — Encounter: Payer: Self-pay | Admitting: Family Medicine

## 2017-04-30 ENCOUNTER — Other Ambulatory Visit: Payer: Self-pay | Admitting: Family Medicine

## 2017-04-30 DIAGNOSIS — J309 Allergic rhinitis, unspecified: Secondary | ICD-10-CM

## 2017-05-27 ENCOUNTER — Other Ambulatory Visit: Payer: BC Managed Care – PPO

## 2017-08-23 ENCOUNTER — Encounter: Payer: Self-pay | Admitting: Family Medicine

## 2017-10-29 ENCOUNTER — Other Ambulatory Visit: Payer: Self-pay | Admitting: Endocrinology

## 2017-10-29 DIAGNOSIS — E049 Nontoxic goiter, unspecified: Secondary | ICD-10-CM

## 2017-11-22 ENCOUNTER — Other Ambulatory Visit: Payer: BC Managed Care – PPO

## 2017-11-22 ENCOUNTER — Ambulatory Visit
Admission: RE | Admit: 2017-11-22 | Discharge: 2017-11-22 | Disposition: A | Discharge status: Home / Self Care | Payer: BC Managed Care – PPO | Source: Ambulatory Visit | Attending: Endocrinology | Admitting: Endocrinology

## 2017-11-22 DIAGNOSIS — E049 Nontoxic goiter, unspecified: Secondary | ICD-10-CM

## 2018-02-17 ENCOUNTER — Encounter: Payer: Self-pay | Admitting: Family Medicine

## 2018-02-18 LAB — HM PAP SMEAR: HM Pap smear: NORMAL

## 2018-03-10 LAB — HM DEXA SCAN

## 2018-07-22 ENCOUNTER — Other Ambulatory Visit: Payer: Self-pay | Admitting: Family Medicine

## 2018-07-22 DIAGNOSIS — J309 Allergic rhinitis, unspecified: Secondary | ICD-10-CM

## 2018-07-22 NOTE — Telephone Encounter (Signed)
Not seen since 11/2016, canceled 11/2017 appt and has no visits scheduled.  rx for flonase denied, needs appt.

## 2018-07-22 NOTE — Telephone Encounter (Signed)
Is this ok to refill?  

## 2018-08-07 DIAGNOSIS — T7020XA Unspecified effects of high altitude, initial encounter: Secondary | ICD-10-CM

## 2018-08-07 HISTORY — DX: Unspecified effects of high altitude, initial encounter: T70.20XA

## 2018-08-12 ENCOUNTER — Ambulatory Visit: Payer: BC Managed Care – PPO | Admitting: Family Medicine

## 2018-08-12 ENCOUNTER — Encounter: Payer: Self-pay | Admitting: Family Medicine

## 2018-08-12 VITALS — BP 120/80 | HR 70 | Temp 98.0°F | Resp 16 | Wt 143.8 lb

## 2018-08-12 DIAGNOSIS — J309 Allergic rhinitis, unspecified: Secondary | ICD-10-CM | POA: Diagnosis not present

## 2018-08-12 DIAGNOSIS — R519 Headache, unspecified: Secondary | ICD-10-CM

## 2018-08-12 DIAGNOSIS — R51 Headache: Secondary | ICD-10-CM

## 2018-08-12 DIAGNOSIS — R0981 Nasal congestion: Secondary | ICD-10-CM | POA: Diagnosis not present

## 2018-08-12 MED ORDER — FLUTICASONE PROPIONATE 50 MCG/ACT NA SUSP: 2.0000 | Freq: Every day | NASAL | 0 refills | Status: DC

## 2018-08-12 MED ORDER — AMOXICILLIN 875 MG PO TABS: 875.0000 mg | ORAL_TABLET | Freq: Two times a day (BID) | ORAL | 0 refills | Status: DC

## 2018-08-12 NOTE — Patient Instructions (Signed)
Continue treating your symptoms with Sudafed and Flonase. Stay well hydrated. You may also want to try saline nasal spray and for your flight you might want to have Afrin on hand if you are having a lot of pressure or severe congestion. Do not use this for more than 2 days as discussed.   If you feel as though your symptoms are worsening and a sinus infection becomes more evident, start the Amoxicillin.

## 2018-08-12 NOTE — Progress Notes (Signed)
Chief Complaint  Patient presents with  . sinus pressure    sinus pressure- and headache and ear burning. been going on for a week. taking sudafed, flonase. about to travel on sunday.    Subjective:  Morgan Delacruz is a 58 y.o. female who presents for a one week history of nasal congestion, frontal headache and sinus pressure.  States her right ear feels hot at times as well but no ear pain, drainage or fullness.   States she is traveling to [place], leaving this weekend and is concerned that she may get worse.   Request that I refill her Flonase for allergies.  States she was told that she needs to be seen again for a refill could be given.  Denies fever, chills, dizziness, rhinorrhea, sore throat, post nasal drainage, chest pain, palpitations, shortness of breath, cough, abdominal pain, N/V/D.   Treatment to date: Sudafed, Flonase.  Denies sick contacts.  No other aggravating or relieving factors.  No other c/o.  ROS as in subjective.   Objective: Vitals:   08/12/18 1200  BP: 120/80  Pulse: 70  Resp: 16  Temp: 98 F (36.7 C)  SpO2: 98%    General appearance: Alert, WD/WN, no distress, well appearing                             Skin: warm, no rash                           Head: no sinus tenderness                            Eyes: conjunctiva normal, corneas clear, PERRLA                            Ears: pearly TMs, external ear canals normal                          Nose: septum midline, turbinates swollen, with erythema and no discharge             Mouth/throat: MMM, tongue normal, mild pharyngeal erythema                           Neck: supple, no adenopathy, no thyromegaly, nontender                          Heart: RRR, normal S1, S2, no murmurs                         Lungs: CTA bilaterally, no wheezes, rales, or rhonchi      Assessment: Nasal congestion - Plan: fluticasone (FLONASE) 50 MCG/ACT nasal spray  Frontal headache  Allergic rhinitis - no evidence of  bacterial infection. Supportive measures reviewed.  Restart flonase, continue decongestants prn, antihistamines prn - Plan: fluticasone (FLONASE) 50 MCG/ACT nasal spray    Plan: Discussed that she may have an early sinusitis however I am not convinced that she currently has an infection.  She will continue treating allergic rhinitis with Flonase and I will give her a one-month refill.  She will continue using Sudafed as needed.  Discussed also using saline nasal spray and potentially using a Chad  pot.  She may have Afrin on hand if she is having more head congestion while flying.  Discussed potential side effects. Amoxicillin prescribed in case she worsens while away on vacation.

## 2019-02-14 ENCOUNTER — Other Ambulatory Visit: Payer: Self-pay | Admitting: Endocrinology

## 2019-02-14 DIAGNOSIS — E049 Nontoxic goiter, unspecified: Secondary | ICD-10-CM

## 2019-02-15 ENCOUNTER — Ambulatory Visit
Admission: RE | Admit: 2019-02-15 | Discharge: 2019-02-15 | Disposition: A | Discharge status: Home / Self Care | Payer: BC Managed Care – PPO | Source: Ambulatory Visit | Attending: Endocrinology | Admitting: Endocrinology

## 2019-02-15 DIAGNOSIS — E049 Nontoxic goiter, unspecified: Secondary | ICD-10-CM

## 2019-02-23 LAB — HM MAMMOGRAPHY

## 2019-04-22 ENCOUNTER — Other Ambulatory Visit: Payer: Self-pay | Admitting: Family Medicine

## 2019-04-22 DIAGNOSIS — R0981 Nasal congestion: Secondary | ICD-10-CM

## 2019-04-22 DIAGNOSIS — J309 Allergic rhinitis, unspecified: Secondary | ICD-10-CM

## 2019-06-14 ENCOUNTER — Encounter: Payer: Self-pay | Admitting: *Deleted

## 2019-06-21 ENCOUNTER — Encounter: Payer: Self-pay | Admitting: Family Medicine

## 2019-06-21 ENCOUNTER — Other Ambulatory Visit: Payer: Self-pay

## 2019-06-21 ENCOUNTER — Ambulatory Visit: Payer: BC Managed Care – PPO | Admitting: Family Medicine

## 2019-06-21 VITALS — BP 109/71 | Ht 65.0 in | Wt 126.0 lb

## 2019-06-21 DIAGNOSIS — Z0000 Encounter for general adult medical examination without abnormal findings: Secondary | ICD-10-CM

## 2019-06-21 DIAGNOSIS — F411 Generalized anxiety disorder: Secondary | ICD-10-CM

## 2019-06-21 DIAGNOSIS — R519 Headache, unspecified: Secondary | ICD-10-CM | POA: Diagnosis not present

## 2019-06-21 DIAGNOSIS — R0981 Nasal congestion: Secondary | ICD-10-CM

## 2019-06-21 DIAGNOSIS — R5383 Other fatigue: Secondary | ICD-10-CM

## 2019-06-21 DIAGNOSIS — J309 Allergic rhinitis, unspecified: Secondary | ICD-10-CM | POA: Diagnosis not present

## 2019-06-21 DIAGNOSIS — M8580 Other specified disorders of bone density and structure, unspecified site: Secondary | ICD-10-CM

## 2019-06-21 MED ORDER — FLUTICASONE PROPIONATE 50 MCG/ACT NA SUSP: 2.0000 | Freq: Every day | NASAL | 11 refills | Status: DC

## 2019-06-21 NOTE — Patient Instructions (Addendum)
Take the Flonase daily (it works much better this way). I sent refills to [facility]. Use claritin or zyrtec or Allegra once daily in addition to the flonase, if needed. Use the sudafed sparingly (since it may cause insomnia), if you have sinus headaches that aren't relieved by sinus rinses and using your allergy medications.  We discussed counseling (and/or grief counseling as well). I believe that Delmer Islam may have retired.  You can check out the other therapists at [facility], or you can look into [facility].  I highly recommend Marya Amsler over there.  Phone number is [phone].  You can check out their website to see the profiles of their therapists. If anxiety is getting worse, we can restart the citalopram, but I think it is reasonable to start with counseling based on your current symptoms.  Continue to get regular exercise and find time for yourself, despite the time it takes in caring for you mom.  Enjoy this time with her (and enjoy their dog and the dog walks!)   Mindfulness-Based Stress Reduction Mindfulness-based stress reduction (MBSR) is a program that helps people learn to practice mindfulness. Mindfulness is the practice of intentionally paying attention to the present moment. It can be learned and practiced through techniques such as education, breathing exercises, meditation, and yoga. MBSR includes several mindfulness techniques in one program. MBSR works best when you understand the treatment, are willing to try new things, and can commit to spending time practicing what you learn. MBSR training may include learning about:  How your emotions, thoughts, and reactions affect your body.  New ways to respond to things that cause negative thoughts to start (triggers).  How to notice your thoughts and let go of them.  Practicing awareness of everyday things that you normally do without thinking.  The techniques and goals of different types of  meditation. What are the benefits of MBSR? MBSR can have many benefits, which include helping you to:  Develop self-awareness. This refers to knowing and understanding yourself.  Learn skills and attitudes that help you to participate in your own health care.  Learn new ways to care for yourself.  Be more accepting about how things are, and let things go.  Be less judgmental and approach things with an open mind.  Be patient with yourself and trust yourself more. MBSR has also been shown to:  Reduce negative emotions, such as depression and anxiety.  Improve memory and focus.  Change how you sense and approach pain.  Boost your body's ability to fight infections.  Help you connect better with other people.  Improve your sense of well-being. Follow these instructions at home:   Find a local in-person or online MBSR program.  Set aside some time regularly for mindfulness practice.  Find a mindfulness practice that works best for you. This may include one or more of the following: ? Meditation. Meditation involves focusing your mind on a certain thought or activity. ? Breathing awareness exercises. These help you to stay present by focusing on your breath. ? Body scan. For this practice, you lie down and pay attention to each part of your body from head to toe. You can identify tension and soreness and intentionally relax parts of your body. ? Yoga. Yoga involves stretching and breathing, and it can improve your ability to move and be flexible. It can also provide an experience of testing your body's limits, which can help you release stress. ? Mindful eating. This way of eating  involves focusing on the taste, texture, color, and smell of each bite of food. Because this slows down eating and helps you feel full sooner, it can be an important part of a weight-loss plan.  Find a podcast or recording that provides guidance for breathing awareness, body scan, or meditation  exercises. You can listen to these any time when you have a free moment to rest without distractions.  Follow your treatment plan as told by your health care provider. This may include taking regular medicines and making changes to your diet or lifestyle as recommended. How to practice mindfulness To do a basic awareness exercise:  Find a comfortable place to sit.  Pay attention to the present moment. Observe your thoughts, feelings, and surroundings just as they are.  Avoid placing judgment on yourself, your feelings, or your surroundings. Make note of any judgment that comes up, and let it go.  Your mind may wander, and that is okay. Make note of when your thoughts drift, and return your attention to the present moment. To do basic mindfulness meditation:  Find a comfortable place to sit. This may include a stable chair or a firm floor cushion. ? Sit upright with your back straight. Let your arms fall next to your side with your hands resting on your legs. ? If sitting in a chair, rest your feet flat on the floor. ? If sitting on a cushion, cross your legs in front of you.  Keep your head in a neutral position with your chin dropped slightly. Relax your jaw and rest the tip of your tongue on the roof of your mouth. Drop your gaze to the floor. You can close your eyes if you like.  Breathe normally and pay attention to your breath. Feel the air moving in and out of your nose. Feel your belly expanding and relaxing with each breath.  Your mind may wander, and that is okay. Make note of when your thoughts drift, and return your attention to your breath.  Avoid placing judgment on yourself, your feelings, or your surroundings. Make note of any judgment or feelings that come up, let them go, and bring your attention back to your breath.  When you are ready, lift your gaze or open your eyes. Pay attention to how your body feels after the meditation. Where to find more information You can  find more information about MBSR from:  Your health care provider.  Community-based meditation centers or programs.  Programs offered near you. Summary  Mindfulness-based stress reduction (MBSR) is a program that teaches you how to intentionally pay attention to the present moment. It is used with other treatments to help you cope better with daily stress, emotions, and pain.  MBSR focuses on developing self-awareness, which allows you to respond to life stress without judgment or negative emotions.  MBSR programs may involve learning different mindfulness practices, such as breathing exercises, meditation, yoga, body scan, or mindful eating. Find a mindfulness practice that works best for you, and set aside time for it on a regular basis. This information is not intended to replace advice given to you by your health care provider. Make sure you discuss any questions you have with your health care provider. Document Revised: 05/07/2017 Document Reviewed: 10/01/2016 Elsevier Patient Education  [place].

## 2019-06-21 NOTE — Progress Notes (Signed)
Start time: 2:39 End time: 3:26  Virtual Visit via Video Note  I connected with Morgan Delacruz on 06/21/19 at  2:15 PM EST by a video enabled telemedicine application and verified that I am speaking with the correct person using two identifiers.  Location: Patient: outside at her parents house (and in her car) Provider: office   I discussed the limitations of evaluation and management by telemedicine and the availability of in person appointments. The patient expressed understanding and agreed to proceed.  History of Present Illness: Chief Complaint  Patient presents with  . Consult    VIRTUAL has been having HA's and has allergies this time of year. Had live Christmas tree and thinks it made worse. Needs new rx nasal spray. Has been experiencing increased anxiety and took citalopram in the past and was thinking about getting back on something. Also had high chol in the past and was wanting to follow up on that. (I scheduled her for CPE 2/11/21and fasting labs for 07/18/19.) Need orders please.    Headaches and allergies: This time of year usually has flare in her allergies, and she has also been having headaches (which she admits may be contributed by stress, in addition to allergies). She started using a humidifier which has helped. She has some sneezing, congestion.  Denies discolored mucus.  Slight scratchy throat in the mornings, short-lived. Has taken sudafed prn, no antihistamines. She is using Flonase, uses it prn, not every day. Headache are mostly frontal, sometimes on the right side of her head.  She has had fewer headaches over the last week. She denies any neurologic symptoms (no change in vision, numbness, tingling, weakness, poor balance or other neuro concerns--sometimes has trouble finding words, relates this to stress, not sleeping well).  Anxiety-- Previously took citalopram, took it for many years.  She think last rx was from 2019, but she wasn't taking it much  then. Most days she is fine, but some days she feels overwhelmed.  Sister with anxiety--has caused lots of family stress (was on disability, lived with parents, they made her move out.) Other sister lost her husband last year (12/2018, committed suicide), which has been hard on the family. Her mother had PE's, and she has been helping care for her. She is there MWF and on weekends (sometimes more, if she has appointments on T/Th).  Sometimes has trouble sleeping, thinks contributed by stress.  She just recently started going to a new gym with her husband last week, went 3x.  Also doing jazzercise there.  Just starting to get back into that routine. Prev saw therapist, Delmer Islam.  H/o elevated cholesterol per last check, wants checked again. Diagnosed with osteopenia per DEXA done at GYN.  Sees Dr. Chalmers Cater for multinodular goiter.  She saw her recently, no changes were made.   PMH, PSH, SH and FH were reviewed and updated  Outpatient Encounter Medications as of 06/21/2019  Medication Sig  . fluticasone (FLONASE) 50 MCG/ACT nasal spray PLACE 2 SPRAYS INTO BOTH NOSTRILS DAILY.  Marland Kitchen levothyroxine (SYNTHROID, LEVOTHROID) 88 MCG tablet Take 88 mcg by mouth daily before breakfast.   . acetaminophen (TYLENOL) 325 MG tablet Take 650 mg by mouth every 6 (six) hours as needed.  . phenylephrine (SUDAFED PE) 10 MG TABS tablet Take 10 mg by mouth every 4 (four) hours as needed.  . [DISCONTINUED] amoxicillin (AMOXIL) 875 MG tablet Take 1 tablet (875 mg total) by mouth 2 (two) times daily.   No facility-administered encounter medications  on file as of 06/21/2019.   Allergies  Allergen Reactions  . Codeine Other (See Comments)    Heart racing/palpitations.   ROS: no fever, chills, URI symptoms.  Headaches and allergies per HPI.  No shortness of breath, GI complaints or other concerns, except as noted in HPI   Observations/Objective: BP 109/71   Ht _0  (1.651 m)   Wt 126 lb (57.2 kg)   LMP  08/24/2011   BMI 20.97 kg/m   Pleasant, well-appearing female, in no distress Normal eye contact, speech, mood, affect, grooming She is alert, oriented, cranial nerves grossly intact. Exam limited due to virtual nature of the visit.  Assessment and Plan:  Allergic rhinitis, unspecified seasonality, unspecified trigger - use Flonase daily, antihistamine daily (if needed), rather than just sudafed (which may contribute to insomnia) - Plan: fluticasone (FLONASE) 50 MCG/ACT nasal spray  Generalized anxiety disorder - counseled regarding stress reduction, time for self. GAD-7 score of 3. Counseling rec, meds only if worsening  Frontal headache - suspect sinus headache, should respond to daily use of Flonase/allergy meds  Fatigue, unspecified type - Plan: CBC with Differential, Vitamin D 25 hydroxy, Comprehensive metabolic panel  Nasal congestion - Plan: fluticasone (FLONASE) 50 MCG/ACT nasal spray  Osteopenia, unspecified location - Plan: Vitamin D 25 hydroxy  Annual physical exam - Plan: CBC with Differential, Lipid panel, Vitamin D 25 hydroxy, Comprehensive metabolic panel  Prefers counseling over restarting meds Strong faith, good support system  F/u at CPE in 07/2019.  Lab orders entered, to be done prior. c-met, CBC, lipids, D   Take the Flonase daily (it works much better this way). Use claritin or zyrtec or Allegra once daily in addition to the flonase, if needed. Use the sudafed sparingly (since it may cause insomnia), if you have sinus headaches that aren't relieved by sinus rinses and using your allergy medications.   47 minutes of face-to-face time through video with another 5-10 completing documentation and chart review, updating chart.   Follow Up Instructions:    I discussed the assessment and treatment plan with the patient. The patient was provided an opportunity to ask questions and all were answered. The patient agreed with the plan and demonstrated an  understanding of the instructions.   The patient was advised to call back or seek an in-person evaluation if the symptoms worsen or if the condition fails to improve as anticipated.  I provided 47 minutes of non-face-to-face time during this encounter.   Vikki Ports, MD

## 2019-07-18 ENCOUNTER — Other Ambulatory Visit: Payer: Self-pay

## 2019-07-18 ENCOUNTER — Other Ambulatory Visit: Payer: BC Managed Care – PPO

## 2019-07-18 DIAGNOSIS — M8580 Other specified disorders of bone density and structure, unspecified site: Secondary | ICD-10-CM

## 2019-07-18 DIAGNOSIS — Z0000 Encounter for general adult medical examination without abnormal findings: Secondary | ICD-10-CM

## 2019-07-18 DIAGNOSIS — R5383 Other fatigue: Secondary | ICD-10-CM

## 2019-07-19 ENCOUNTER — Encounter: Payer: Self-pay | Admitting: Family Medicine

## 2019-07-19 LAB — COMPREHENSIVE METABOLIC PANEL
ALT: 11 IU/L (ref 0–32)
AST: 21 IU/L (ref 0–40)
Albumin/Globulin Ratio: 2.2 (ref 1.2–2.2)
Albumin: 4.2 g/dL (ref 3.8–4.9)
Alkaline Phosphatase: 73 IU/L (ref 39–117)
BUN/Creatinine Ratio: 17 (ref 9–23)
BUN: 13 mg/dL (ref 6–24)
Bilirubin Total: 0.5 mg/dL (ref 0.0–1.2)
CO2: 21 mmol/L (ref 20–29)
Calcium: 9.6 mg/dL (ref 8.7–10.2)
Chloride: 106 mmol/L (ref 96–106)
Creatinine, Ser: 0.78 mg/dL (ref 0.57–1.00)
GFR calc Af Amer: 96 mL/min/{1.73_m2} (ref >59)
GFR calc non Af Amer: 83 mL/min/{1.73_m2} (ref >59)
Globulin, Total: 1.9 g/dL (ref 1.5–4.5)
Glucose: 81 mg/dL (ref 65–99)
Potassium: 4.6 mmol/L (ref 3.5–5.2)
Sodium: 145 mmol/L — ABNORMAL HIGH (ref 134–144)
Total Protein: 6.1 g/dL (ref 6.0–8.5)

## 2019-07-19 LAB — LIPID PANEL
Chol/HDL Ratio: 2.6 ratio (ref 0.0–4.4)
Cholesterol, Total: 197 mg/dL (ref 100–199)
HDL: 77 mg/dL (ref >39)
LDL Chol Calc (NIH): 110 mg/dL — ABNORMAL HIGH (ref 0–99)
Triglycerides: 52 mg/dL (ref 0–149)
VLDL Cholesterol Cal: 10 mg/dL (ref 5–40)

## 2019-07-19 LAB — CBC WITH DIFFERENTIAL/PLATELET
Basophils Absolute: 0.1 10*3/uL (ref 0.0–0.2)
Basos: 1 %
EOS (ABSOLUTE): 0.4 10*3/uL (ref 0.0–0.4)
Eos: 9 %
Hematocrit: 37.4 % (ref 34.0–46.6)
Hemoglobin: 12.5 g/dL (ref 11.1–15.9)
Immature Grans (Abs): 0 10*3/uL (ref 0.0–0.1)
Immature Granulocytes: 0 %
Lymphocytes Absolute: 1.6 10*3/uL (ref 0.7–3.1)
Lymphs: 39 %
MCH: 32 pg (ref 26.6–33.0)
MCHC: 33.4 g/dL (ref 31.5–35.7)
MCV: 96 fL (ref 79–97)
Monocytes Absolute: 0.3 10*3/uL (ref 0.1–0.9)
Monocytes: 7 %
Neutrophils Absolute: 1.8 10*3/uL (ref 1.4–7.0)
Neutrophils: 44 %
Platelets: 272 10*3/uL (ref 150–450)
RBC: 3.91 x10E6/uL (ref 3.77–5.28)
RDW: 11.5 % — ABNORMAL LOW (ref 11.7–15.4)
WBC: 4.1 10*3/uL (ref 3.4–10.8)

## 2019-07-19 LAB — VITAMIN D 25 HYDROXY (VIT D DEFICIENCY, FRACTURES): Vit D, 25-Hydroxy: 26.7 ng/mL — ABNORMAL LOW (ref 30.0–100.0)

## 2019-07-19 NOTE — Patient Instructions (Addendum)
  HEALTH MAINTENANCE RECOMMENDATIONS:  It is recommended that you get at least 30 minutes of aerobic exercise at least 5 days/week (for weight loss, you may need as much as 60-90 minutes). This can be any activity that gets your heart rate up. This can be divided in 10-15 minute intervals if needed, but try and build up your endurance at least once a week.  Weight bearing exercise is also recommended twice weekly.  Eat a healthy diet with lots of vegetables, fruits and fiber.  "Colorful" foods have a lot of vitamins (ie green vegetables, tomatoes, red peppers, etc).  Limit sweet tea, regular sodas and alcoholic beverages, all of which has a lot of calories and sugar.  Up to 1 alcoholic drink daily may be beneficial for women (unless trying to lose weight, watch sugars).  Drink a lot of water.  Calcium recommendations are 1200-1500 mg daily (1500 mg for postmenopausal women or women without ovaries), and vitamin D 1000 IU daily.  This should be obtained from diet and/or supplements (vitamins), and calcium should not be taken all at once, but in divided doses.  Monthly self breast exams and yearly mammograms for women over the age of 30 is recommended.  Sunscreen of at least SPF 30 should be used on all sun-exposed parts of the skin when outside between the hours of 10 am and 4 pm (not just when at beach or pool, but even with exercise, golf, tennis, and yard work!)  Use a sunscreen that says "broad spectrum" so it covers both UVA and UVB rays, and make sure to reapply every 1-2 hours.  Remember to change the batteries in your smoke detectors when changing your clock times in the spring and fall. Carbon monoxide detectors are recommended for your home.  Use your seat belt every time you are in a car, and please drive safely and not be distracted with cell phones and texting while driving.  Yearly flu shots are recommended. COVID vaccine is recommended when available to you. Places to find information  regarding the vaccine-- [employer] COVID-19 Vaccine Information can be found at: [employer]Sc[employer].co.uk For questions related to vaccine distribution or appointments, please email vaccine@[email or web address] or call [phone].   I believe that [facility] will be giving vaccines in the very near future (and you need to register on their app--details should be in the news in the next week or so).  I recommend getting the new shingles vaccine (Shingrix). You will need to check with your insurance to see if it is covered, and if covered, schedule a nurse visit at our office when convenient.  It is a series of 2 injections, spaced 2 months apart.  This should be started at least 4 weeks after completing the COVID vaccine series (or finished 2 weeks prior to starting it).  A second Hepatitis A shot was given today (last of the series).   We discussed taking 2000 IU daily of over-the-counter vitamin D3.  You may take a daily multivitamin along with it--but it if constipates you and you need to take it just a few times/week (the multivitamin), that's okay. Keep taking the D every day. You can take Tums as a calcium supplement if needed (if you aren't getting 1200-1500mg  through your diet alone), and see if this is less constipating. Drink lots of water and eat high fiber diet to help keep your bowel regular.

## 2019-07-19 NOTE — Progress Notes (Signed)
Chief Complaint  Patient presents with  . Annual Exam    nonfasting annual exam, no pap. No new concerns.   . Flu Vaccine    declined but will get COVID vaccine when available.     Morgan Delacruz is a 59 y.o. female who presents for a complete physical.    She was recently seen for headaches, allergies and anxiety.  She was encouraged to start counseling (and restart meds (prev took citalopram) only if not improving--had significant stressors related to sister's anxiety and health, loss of her brother-in-law, and mother's health, with PE's, and needing to be there a lot). She was encouraged to use Flonase daily for her allergies and headaches. She still has a few headaches, but is a little bit better.  She reports that she lost 20# after losing her brother-in-law. That was not intentional weight loss, but is happy at this weight, and is now trying to maintain this weight.  She reports that her husband came down with COVID shortly after our last visit.  She quarantined, and didn't get sick. So she hasn't had a chance to call for therapy . She has been exercising, working on mindfulness, and she feels like she is doing better and prefers to hold off on starting counseling for now.  H/o elevated lipids in the past.  Had labs done prior to visit, see below.  Sees Dr. Chalmers Cater for multinodular goiter.  She saw her recently, no changes were made.  She admits that she has been somewhat sporadic in taking her calcium and MVI, due to the constipation it causes.  Has been trying to take it more regularly. She reports she was told she had osteopenia on bone density scan done by GYN in the past.  We have no records from her GYN.   Immunization History  Administered Date(s) Administered  . Hepatitis A, Adult 11/25/2016  . Tdap 11/25/2016   Last Pap smear: [facility], UTD per pt (Dr. Alfred Levins) Last mammogram: at GYN's office, UTD per pt Last colonoscopy: 04/2010, Dr. Earlean Shawl Last DEXA: per  GYN, osteopenia per pt Dentist: twice a year Ophtho: every other year, due Exercise: Recently joined gym, and does Jazzercise there. Does weights 2x/week, cardio 3-4x/week.  Plans to also do some yoga classes.  PMH, PSH, SH and FH were reviewed and updated.  Outpatient Encounter Medications as of 07/20/2019  Medication Sig Note  . calcium-vitamin D (OSCAL WITH D) 500-200 MG-UNIT tablet Take 1 tablet by mouth daily.   . fluticasone (FLONASE) 50 MCG/ACT nasal spray Place 2 sprays into both nostrils daily.   Marland Kitchen levothyroxine (SYNTHROID, LEVOTHROID) 88 MCG tablet Take 88 mcg by mouth daily before breakfast.    . Multiple Vitamins-Minerals (MULTIVITAMIN ADULTS 50+ PO) Take 1 tablet by mouth daily. 07/20/2019: Only recently more consistent in taking it  . acetaminophen (TYLENOL) 325 MG tablet Take 650 mg by mouth every 6 (six) hours as needed.   . phenylephrine (SUDAFED PE) 10 MG TABS tablet Take 10 mg by mouth every 4 (four) hours as needed.    No facility-administered encounter medications on file as of 07/20/2019.    ROS:  The patient denies anorexia, fever, vision changes, decreased hearing, ear pain, sore throat, breast concerns, chest pain, palpitations, dizziness, syncope, dyspnea on exertion, cough, swelling, nausea, vomiting, diarrhea, constipation, abdominal pain, melena, hematochezia, indigestion/heartburn, hematuria, incontinence, dysuria, vaginal bleeding, discharge, odor or itch, genital lesions, joint pains, numbness, tingling, weakness, tremor, suspicious skin lesions, depression, abnormal bleeding/bruising, or enlarged  lymph nodes. Headaches are improving (since restarting daily Flonase). Anxiety has improved, more manageable. +weight loss after the loss of her brother-in-law.  No ongoing losses.   PHYSICAL EXAM:  BP 112/64   Pulse 68   Temp (!) 96.9 F (36.1 C) (Other (Comment))   Ht 5\' 5"  (1.651 m)   Wt 126 lb 3.2 oz (57.2 kg)   LMP 08/24/2011   BMI 21.00 kg/m    Wt  Readings from Last 3 Encounters:  07/20/19 126 lb 3.2 oz (57.2 kg)  06/21/19 126 lb (57.2 kg)  08/12/18 143 lb 12.8 oz (65.2 kg)  11/2016 wt 143# 12.8 oz   General Appearance:    Alert, cooperative, no distress, appears stated age  Head:    Normocephalic, without obvious abnormality, atraumatic  Eyes:    PERRL, conjunctiva/corneas clear, EOM's intact, fundi    benign  Ears:    Normal TM's and external ear canals  Nose:   Not examined, wearing mask due to COVID-19 pandemic  Throat:   Not examined, wearing mask due to COVID-19 pandemic  Neck:   Supple, no lymphadenopathy;  thyroid:  No enlargement/ tenderness.  Nodule palpable on R lobe of thyroid, nontender.  no carotid bruit or JVD  Back:    Spine nontender, no curvature, ROM normal, no CVA     tenderness  Lungs:     Clear to auscultation bilaterally without wheezes, rales or     ronchi; respirations unlabored  Chest Wall:    No tenderness or deformity   Heart:    Regular rate and rhythm, S1 and S2 normal, no murmur, rub   or gallop  Breast Exam:    Deferred to GYN  Abdomen:     Soft, non-tender, nondistended, normoactive bowel sounds,    no masses, no hepatosplenomegaly  Genitalia:    Deferred to GYN     Extremities:   No clubbing, cyanosis or edema  Pulses:   2+ and symmetric all extremities  Skin:   Skin color, texture, turgor normal, no rashes or lesions (limited exam, didn't change into gown)  Lymph nodes:   Cervical, supraclavicular, and axillary nodes normal  Neurologic:   Normal strength, sensation and gait; reflexes 2+ and symmetric throughout               Psych:   Normal mood, affect, hygiene and grooming.      Chemistry      Component Value Date/Time   NA 145 (H) 07/18/2019 0818   K 4.6 07/18/2019 0818   CL 106 07/18/2019 0818   CO2 21 07/18/2019 0818   BUN 13 07/18/2019 0818   CREATININE 0.78 07/18/2019 0818      Component Value Date/Time   CALCIUM 9.6 07/18/2019 0818   ALKPHOS 73 07/18/2019 0818   AST 21  07/18/2019 0818   ALT 11 07/18/2019 0818   BILITOT 0.5 07/18/2019 0818     Fasting glucose 81  Lab Results  Component Value Date   WBC 4.1 07/18/2019   HGB 12.5 07/18/2019   HCT 37.4 07/18/2019   MCV 96 07/18/2019   PLT 272 07/18/2019   Lab Results  Component Value Date   CHOL 197 07/18/2019   HDL 77 07/18/2019   LDLCALC 110 (H) 07/18/2019   TRIG 52 07/18/2019   CHOLHDL 2.6 07/18/2019   Vitamin D-OH 26.7   ASSESSMENT/PLAN:  Annual physical exam - Plan: POCT Urinalysis DIP (Proadvantage Device)  Vitamin D deficiency - level is low, due to inconsistent supplements.  Counseled re: supplementation  Osteopenia, unspecified location - Counseled re: Ca (via diet, supplements, and how to minimize constipation), vit D, weight-bearing exercise. Will get DEXA results from GYN  Need for hepatitis A vaccination - had first in 2018, recommend completing the booster today. Is aware of need to wait 2 weeks before she can get COVID vaccine - Plan: Hepatitis A vaccine adult IM  Need for hepatitis C screening test - will see if lab can be added to blood drawn earlier in the week  Vaccine counseling - counseled re: Hep A, Shingrix, COVID vaccines, as well as flu shot which she declines  Pt to sign release of records for GYN records--any labs, pap, DEXA, mammo, colonoscopy (we only have date, not report).   Discussed monthly self breast exams and yearly mammograms; at least 30 minutes of aerobic activity at least 5 days/week, weight-bearing exercise at least 2x.wk; proper sunscreen use reviewed; healthy diet, including goals of calcium and vitamin D intake and alcohol recommendations (less than or equal to 1 drink/day) reviewed; regular seatbelt use; changing batteries in smoke detectors.  Immunization recommendations discussed--yearly flu shots (she declines), shingrix and COVID vaccine recommended. Hepatitis A #2 given today. Colonoscopy recommendations reviewed, due later this year with Dr.  Earlean Shawl.  F/u 1 year, sooner prn. Can schedule Shingrix as [place] when convenient (likely after completing COVID series)

## 2019-07-20 ENCOUNTER — Ambulatory Visit: Payer: BC Managed Care – PPO | Admitting: Family Medicine

## 2019-07-20 ENCOUNTER — Encounter: Payer: Self-pay | Admitting: Family Medicine

## 2019-07-20 VITALS — BP 112/64 | HR 68 | Temp 96.9°F | Ht 65.0 in | Wt 126.2 lb

## 2019-07-20 DIAGNOSIS — Z0000 Encounter for general adult medical examination without abnormal findings: Secondary | ICD-10-CM | POA: Diagnosis not present

## 2019-07-20 DIAGNOSIS — E559 Vitamin D deficiency, unspecified: Secondary | ICD-10-CM

## 2019-07-20 DIAGNOSIS — M8580 Other specified disorders of bone density and structure, unspecified site: Secondary | ICD-10-CM | POA: Diagnosis not present

## 2019-07-20 DIAGNOSIS — Z7185 Encounter for immunization safety counseling: Secondary | ICD-10-CM

## 2019-07-20 DIAGNOSIS — Z23 Encounter for immunization: Secondary | ICD-10-CM

## 2019-07-20 DIAGNOSIS — Z1159 Encounter for screening for other viral diseases: Secondary | ICD-10-CM

## 2019-07-20 DIAGNOSIS — Z7189 Other specified counseling: Secondary | ICD-10-CM

## 2019-07-20 LAB — POCT URINALYSIS DIP (PROADVANTAGE DEVICE)
Bilirubin, UA: NEGATIVE
Blood, UA: NEGATIVE
Glucose, UA: NEGATIVE mg/dL
Ketones, POC UA: NEGATIVE mg/dL
Leukocytes, UA: NEGATIVE
Nitrite, UA: NEGATIVE
Protein Ur, POC: NEGATIVE mg/dL
Specific Gravity, Urine: 1.01
Urobilinogen, Ur: NEGATIVE
pH, UA: 6 (ref 5.0–8.0)

## 2019-07-27 LAB — HEPATITIS C ANTIBODY: Hep C Virus Ab: <0.1 s/co ratio (ref 0.0–0.9)

## 2019-07-27 LAB — SPECIMEN STATUS REPORT

## 2019-08-09 ENCOUNTER — Telehealth: Payer: Self-pay | Admitting: Family Medicine

## 2019-08-09 NOTE — Telephone Encounter (Signed)
Records received from [facility]

## 2019-08-14 ENCOUNTER — Telehealth: Payer: Self-pay | Admitting: Family Medicine

## 2019-08-14 NOTE — Telephone Encounter (Signed)
Received requested records from [facility]

## 2020-02-22 ENCOUNTER — Telehealth: Payer: Self-pay

## 2020-02-22 NOTE — Telephone Encounter (Signed)
Pt. Called stating she wanted to get scheduled to get her first shingrix injection on a Friday. I told her I needed to run it by you first then I could call her back and schedule this.

## 2020-02-22 NOTE — Telephone Encounter (Signed)
Pt. Has been scheduled on 03/01/20 for first shingrix inj. And on 05/10/20 for 2nd shingrix inj.

## 2020-02-22 NOTE — Telephone Encounter (Signed)
She was counseled regarding Shingrix at her 07/2019 CPE and advised to schedule when convenient. Please schedule. She will need 2nd vaccine scheduled 2 months (or later, but not earlier) from the first.

## 2020-03-01 ENCOUNTER — Other Ambulatory Visit: Payer: Self-pay | Admitting: Endocrinology

## 2020-03-01 ENCOUNTER — Other Ambulatory Visit: Payer: BC Managed Care – PPO

## 2020-03-01 DIAGNOSIS — E049 Nontoxic goiter, unspecified: Secondary | ICD-10-CM

## 2020-03-12 LAB — HM MAMMOGRAPHY

## 2020-03-12 LAB — HM DEXA SCAN

## 2020-03-13 ENCOUNTER — Other Ambulatory Visit: Payer: BC Managed Care – PPO

## 2020-03-15 ENCOUNTER — Other Ambulatory Visit: Payer: BC Managed Care – PPO

## 2020-03-19 ENCOUNTER — Ambulatory Visit
Admission: RE | Admit: 2020-03-19 | Discharge: 2020-03-19 | Disposition: A | Discharge status: Home / Self Care | Payer: BC Managed Care – PPO | Source: Ambulatory Visit | Attending: Endocrinology | Admitting: Endocrinology

## 2020-03-19 DIAGNOSIS — E049 Nontoxic goiter, unspecified: Secondary | ICD-10-CM

## 2020-04-03 ENCOUNTER — Other Ambulatory Visit: Payer: Self-pay

## 2020-04-03 ENCOUNTER — Other Ambulatory Visit (INDEPENDENT_AMBULATORY_CARE_PROVIDER_SITE_OTHER): Payer: BC Managed Care – PPO

## 2020-04-03 DIAGNOSIS — Z23 Encounter for immunization: Secondary | ICD-10-CM | POA: Diagnosis not present

## 2020-04-30 LAB — HM COLONOSCOPY

## 2020-05-08 ENCOUNTER — Encounter: Payer: Self-pay | Admitting: *Deleted

## 2020-05-09 ENCOUNTER — Encounter: Payer: Self-pay | Admitting: Family Medicine

## 2020-05-09 ENCOUNTER — Telehealth: Payer: Self-pay

## 2020-05-09 NOTE — Telephone Encounter (Signed)
Pt. Called stating that she got her 1st Shingrix on 04/03/20 and is scheduled for her 2nd Shingrix on 06/05/20 and couldn't remember if you said she could get her covid booster between shots or if she needed to wait until after she got her 2nd Shingrix shot.

## 2020-05-09 NOTE — Telephone Encounter (Signed)
Called pt. LM stating she can schedule her booster now just has to be two prior to getting her 2nd shingrix vaccine.

## 2020-05-09 NOTE — Telephone Encounter (Signed)
She can get the booster in between (2 weeks after the Shingrix. It looks like it has been a month. She only needs to wait 2 weeks after the booster before getting the second shingrix, so she should be fine to get it now.

## 2020-05-10 ENCOUNTER — Other Ambulatory Visit: Payer: BC Managed Care – PPO

## 2020-05-16 ENCOUNTER — Ambulatory Visit: Payer: BC Managed Care – PPO

## 2020-05-25 ENCOUNTER — Ambulatory Visit: Payer: BC Managed Care – PPO

## 2020-06-05 ENCOUNTER — Other Ambulatory Visit (INDEPENDENT_AMBULATORY_CARE_PROVIDER_SITE_OTHER): Payer: BC Managed Care – PPO

## 2020-06-05 ENCOUNTER — Other Ambulatory Visit: Payer: Self-pay

## 2020-06-05 DIAGNOSIS — Z23 Encounter for immunization: Secondary | ICD-10-CM | POA: Diagnosis not present

## 2020-06-06 ENCOUNTER — Encounter: Payer: Self-pay | Admitting: Family Medicine

## 2020-07-27 ENCOUNTER — Other Ambulatory Visit: Payer: Self-pay | Admitting: Family Medicine

## 2020-07-27 DIAGNOSIS — J309 Allergic rhinitis, unspecified: Secondary | ICD-10-CM

## 2020-07-27 DIAGNOSIS — R0981 Nasal congestion: Secondary | ICD-10-CM

## 2020-07-30 NOTE — Progress Notes (Signed)
Chief Complaint  Patient presents with  . Annual Exam    Fasting annual exam, no pap. Sees eye doctor for eye exams. No new concerns. Had DEXA at [facility], has osteopenia.    Morgan Delacruz is a 60 y.o. female who presents for a complete physical.    She has no complaints today.  She had anxiety after significant stressors last year (loss of her brother-in-law, mother's health). At her physical last year she reported feeling better, exercising, working on mindfulness She is in a much better place now.  Walking helps when feeling anxious.  Vitamin D level was a little low at 26.7 last year. She had been inconsistent with taking her vitamins.  She is currently taking MVI daily.  H/o elevated lipids in the past. They were good when checked last year. She continues to eat a healthy diet. Occasional red meat. 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 Chalmers Cater for multinodular goiter.  She saw her recently, no changes were made. No notes received from Dr. Almetta Lovely office. She had ultrasound which was stable (03/19/2020): IMPRESSION: 1. Stable thyroid ultrasound. 2. No significant change in the right thyroid nodules. 3. No new suspicious thyroid nodules.   Immunization History  Administered Date(s) Administered  . Hepatitis A, Adult 11/25/2016, 07/20/2019  . Influenza-Unspecified 03/07/2020  . Moderna Sars-Covid-2 Vaccination 08/18/2019, 09/18/2019, 05/11/2020  . Tdap 11/25/2016  . Zoster Recombinat (Shingrix) 04/03/2020, 06/05/2020   Last Pap smear: [facility], 02/2018 (that we received, pt states gets yearly), last visit approx 05/2020 Last mammogram: at GYN's office, UTD per pt Last colonoscopy: 04/2020, Dr. Earlean Shawl.  Diverticulosis, internal hemorrhoids Last DEXA:  03/2018 osteopenia (T -2.1 at spine); it was repeated in 2021 by GYN, recalls  also osteopenia (not osteoporosis)--records not yet received Dentist: twice a year Ophtho: every other year, went last year (some floaters noted) Exercise: walks most days (gets 5-10K steps daily). Has weights, does some YouTube videos (only occasionally with weights). Does yardwork for parents, lifts heavy items, walks the dog around their yard 3x/week.  PMH, PSH, SH and FH were reviewed and updated.  Outpatient Encounter Medications as of 07/31/2020  Medication Sig  . calcium-vitamin D (OSCAL WITH D) 500-200 MG-UNIT tablet Take 1 tablet by mouth daily.  . fluticasone (FLONASE) 50 MCG/ACT nasal spray Place 2 sprays into both nostrils daily.  Marland Kitchen levothyroxine (SYNTHROID, LEVOTHROID) 88 MCG tablet Take 88 mcg by mouth daily before breakfast.   . Multiple Vitamins-Minerals (MULTIVITAMIN ADULTS 50+ PO) Take 1 tablet by mouth daily.  Marland Kitchen acetaminophen (TYLENOL) 325 MG tablet Take 650 mg by mouth every 6 (six) hours as needed. (Patient not taking: Reported on 07/31/2020)  . phenylephrine (SUDAFED PE) 10 MG TABS tablet Take 10 mg by mouth every 4 (four) hours as needed. (Patient not taking: Reported on 07/31/2020)   No facility-administered encounter medications on file as of 07/31/2020.   Allergies  Allergen Reactions  . Codeine Other (See Comments)    Heart racing/palpitations.    ROS:  The patient denies anorexia, fever, vision changes, decreased hearing, ear pain, sore throat, breast concerns, chest pain, palpitations, dizziness, syncope, dyspnea on exertion, cough, swelling, nausea, vomiting, diarrhea, constipation, abdominal pain, melena, hematochezia, indigestion/heartburn, hematuria, incontinence, dysuria, vaginal bleeding, discharge, odor or itch, genital lesions, joint pains, numbness, tingling, weakness, tremor, suspicious skin  lesions, depression, abnormal bleeding/bruising, or enlarged lymph nodes. Moods are good, anxiety is well controlled/manageable. Occasional trouble falling asleep  (related to anxiety) Some congestion from allergies.   PHYSICAL EXAM:  BP 102/70   Pulse 60   Ht 5\' 5"  (1.651 m)   Wt 130 lb 9.6 oz (59.2 kg)   LMP 08/24/2011   BMI 21.73 kg/m   Wt Readings from Last 3 Encounters:  07/31/20 130 lb 9.6 oz (59.2 kg)  07/20/19 126 lb 3.2 oz (57.2 kg)  06/21/19 126 lb (57.2 kg)    General Appearance:    Alert, cooperative, no distress, appears stated age  Head:    Normocephalic, without obvious abnormality, atraumatic  Eyes:    PERRL, conjunctiva/corneas clear, EOM's intact, fundi    benign  Ears:    Normal TM's and external ear canals  Nose:   Not examined, wearing mask due to COVID-19 pandemic  Throat:   Not examined, wearing mask due to COVID-19 pandemic  Neck:   Supple, no lymphadenopathy;  thyroid:  No enlargement/ tenderness.  Nodule palpable on R lobe of thyroid, nontender.  no carotid bruit or JVD  Back:    Spine nontender, no curvature, ROM normal, no CVA     tenderness  Lungs:     Clear to auscultation bilaterally without wheezes, rales or     ronchi; respirations unlabored  Chest Wall:    No tenderness or deformity   Heart:    Regular rate and rhythm, S1 and S2 normal, no murmur, rub   or gallop  Breast Exam:    Deferred to GYN  Abdomen:     Soft, non-tender, nondistended, normoactive bowel sounds,    no masses, no hepatosplenomegaly  Genitalia:    Deferred to GYN     Extremities:   No clubbing, cyanosis or edema  Pulses:   2+ and symmetric all extremities  Skin:   Skin color, texture, turgor normal, no rashes or lesions (limited exam, didn't change into gown)  Lymph nodes:   Cervical, supraclavicular, and axillary nodes normal  Neurologic:   Normal strength, sensation and gait; reflexes 2+ and symmetric throughout                                Psych:   Normal mood, affect, hygiene and grooming.     ASSESSMENT/PLAN:  Annual physical exam - Plan: POCT Urinalysis DIP (Proadvantage Device)  Osteopenia, unspecified location -  Discussed Ca, D, weight-bearing exercise.  will request DEXA results from GYN  Vitamin D deficiency - continue daily supplement  Allergic rhinitis, unspecified seasonality, unspecified trigger - cont Flonase - Plan: fluticasone (FLONASE) 50 MCG/ACT nasal spray  Right thyroid nodule - stable per recent US.  Managed by Dr. Chalmers Cater, along with her hypothyroidism. UTD with visits (not sent to [place])  Will request records from [facility]/Dr. Judith Blonder 11-05/2020, had pap, mammo, DEXA, and pt thinks she had labs.  No labs done today; only one suggested was f/u D level, but can wait and see what was done at GYN.  She is compliant with daily supplement, so likely improved from last year.    Discussed monthly self breast exams and yearly mammograms; at least 30 minutes of aerobic activity at least 5 days/week, weight-bearing exercise at least 2x.wk; proper sunscreen use reviewed; healthy diet, including goals of calcium and vitamin D intake and alcohol recommendations (less than or equal to 1 drink/day) reviewed; regular seatbelt use;  changing batteries in smoke detectors.  Immunization recommendations discussed--continue yearly flu shots.  Colonoscopy recommendations reviewed, UTD  F/u 1 year, sooner prn.

## 2020-07-30 NOTE — Patient Instructions (Incomplete)
HEALTH MAINTENANCE RECOMMENDATIONS:  It is recommended that you get at least 30 minutes of aerobic exercise at least 5 days/week (for weight loss, you may need as much as 60-90 minutes). This can be any activity that gets your heart rate up. This can be divided in 10-15 minute intervals if needed, but try and build up your endurance at least once a week.  Weight bearing exercise is also recommended twice weekly.  Eat a healthy diet with lots of vegetables, fruits and fiber.  "Colorful" foods have a lot of vitamins (ie green vegetables, tomatoes, red peppers, etc).  Limit sweet tea, regular sodas and alcoholic beverages, all of which has a lot of calories and sugar.  Up to 1 alcoholic drink daily may be beneficial for women (unless trying to lose weight, watch sugars).  Drink a lot of water.  Calcium recommendations are 1200-1500 mg daily (1500 mg for postmenopausal women or women without ovaries), and vitamin D 1000 IU daily.  This should be obtained from diet and/or supplements (vitamins), and calcium should not be taken all at once, but in divided doses.  Monthly self breast exams and yearly mammograms for women over the age of 81 is recommended.  Sunscreen of at least SPF 30 should be used on all sun-exposed parts of the skin when outside between the hours of 10 am and 4 pm (not just when at beach or pool, but even with exercise, golf, tennis, and yard work!)  Use a sunscreen that says "broad spectrum" so it covers both UVA and UVB rays, and make sure to reapply every 1-2 hours.  Remember to change the batteries in your smoke detectors when changing your clock times in the spring and fall. Carbon monoxide detectors are recommended for your home.  Use your seat belt every time you are in a car, and please drive safely and not be distracted with cell phones and texting while driving.   Diverticulosis  Diverticulosis is a condition that develops when small pouches (diverticula) form in the wall  of the large intestine (colon). The colon is where water is absorbed and stool (feces) is formed. The pouches form when the inside layer of the colon pushes through weak spots in the outer layers of the colon. You may have a few pouches or many of them. The pouches usually do not cause problems unless they become inflamed or infected. When this happens, the condition is called diverticulitis. What are the causes? The cause of this condition is not known. What increases the risk? The following factors may make you more likely to develop this condition:  Being older than age 35. Your risk for this condition increases with age. Diverticulosis is rare among people younger than age 48. By age 53, many people have it.  Eating a low-fiber diet.  Having frequent constipation.  Being overweight.  Not getting enough exercise.  Smoking.  Taking over-the-counter pain medicines, like aspirin and ibuprofen.  Having a family history of diverticulosis. What are the signs or symptoms? In most people, there are no symptoms of this condition. If you do have symptoms, they may include:  Bloating.  Cramps in the abdomen.  Constipation or diarrhea.  Pain in the lower left side of the abdomen. How is this diagnosed? Because diverticulosis usually has no symptoms, it is most often diagnosed during an exam for other colon problems. The condition may be diagnosed by:  Using a flexible scope to examine the colon (colonoscopy).  Taking an X-ray of the  colon after dye has been put into the colon (barium enema).  Having a CT scan. How is this treated? You may not need treatment for this condition. Your health care provider may recommend treatment to prevent problems. You may need treatment if you have symptoms or if you previously had diverticulitis. Treatment may include:  Eating a high-fiber diet.  Taking a fiber supplement.  Taking a live bacteria supplement (probiotic).  Taking medicine to  relax your colon.   Follow these instructions at home: Medicines  Take over-the-counter and prescription medicines only as told by your health care provider.  If told by your health care provider, take a fiber supplement or probiotic. Constipation prevention Your condition may cause constipation. To prevent or treat constipation, you may need to:  Drink enough fluid to keep your urine pale yellow.  Take over-the-counter or prescription medicines.  Eat foods that are high in fiber, such as beans, whole grains, and fresh fruits and vegetables.  Limit foods that are high in fat and processed sugars, such as fried or sweet foods.   General instructions  Try not to strain when you have a bowel movement.  Keep all follow-up visits as told by your health care provider. This is important. Contact a health care provider if you:  Have pain in your abdomen.  Have bloating.  Have cramps.  Have not had a bowel movement in 3 days. Get help right away if:  Your pain gets worse.  Your bloating becomes very bad.  You have a fever or chills, and your symptoms suddenly get worse.  You vomit.  You have bowel movements that are bloody or black.  You have bleeding from your rectum. Summary  Diverticulosis is a condition that develops when small pouches (diverticula) form in the wall of the large intestine (colon).  You may have a few pouches or many of them.  This condition is most often diagnosed during an exam for other colon problems.  Treatment may include increasing the fiber in your diet, taking supplements, or taking medicines. This information is not intended to replace advice given to you by your health care provider. Make sure you discuss any questions you have with your health care provider. Document Revised: 12/22/2018 Document Reviewed: 12/22/2018 Elsevier Patient Education  [place].

## 2020-07-31 ENCOUNTER — Ambulatory Visit: Payer: BC Managed Care – PPO | Admitting: Family Medicine

## 2020-07-31 ENCOUNTER — Encounter: Payer: Self-pay | Admitting: Family Medicine

## 2020-07-31 ENCOUNTER — Other Ambulatory Visit: Payer: Self-pay

## 2020-07-31 VITALS — BP 102/70 | HR 60 | Ht 65.0 in | Wt 130.6 lb

## 2020-07-31 DIAGNOSIS — Z0000 Encounter for general adult medical examination without abnormal findings: Secondary | ICD-10-CM

## 2020-07-31 DIAGNOSIS — J309 Allergic rhinitis, unspecified: Secondary | ICD-10-CM

## 2020-07-31 DIAGNOSIS — E559 Vitamin D deficiency, unspecified: Secondary | ICD-10-CM | POA: Diagnosis not present

## 2020-07-31 DIAGNOSIS — M8580 Other specified disorders of bone density and structure, unspecified site: Secondary | ICD-10-CM

## 2020-07-31 DIAGNOSIS — E041 Nontoxic single thyroid nodule: Secondary | ICD-10-CM

## 2020-07-31 LAB — POCT URINALYSIS DIP (PROADVANTAGE DEVICE)
Bilirubin, UA: NEGATIVE
Blood, UA: NEGATIVE
Glucose, UA: NEGATIVE mg/dL
Ketones, POC UA: NEGATIVE mg/dL
Leukocytes, UA: NEGATIVE
Nitrite, UA: NEGATIVE
Protein Ur, POC: NEGATIVE mg/dL
Specific Gravity, Urine: 1.005
Urobilinogen, Ur: NEGATIVE
pH, UA: 8.5 — AB (ref 5.0–8.0)

## 2020-07-31 MED ORDER — FLUTICASONE PROPIONATE 50 MCG/ACT NA SUSP: 2.0000 | Freq: Every day | NASAL | 11 refills | Status: DC

## 2020-08-08 ENCOUNTER — Telehealth: Payer: Self-pay | Admitting: Family Medicine

## 2020-08-08 NOTE — Telephone Encounter (Signed)
Requested records received from [facility]

## 2020-08-13 ENCOUNTER — Encounter: Payer: Self-pay | Admitting: Family Medicine

## 2021-03-05 ENCOUNTER — Encounter: Payer: Self-pay | Admitting: *Deleted

## 2021-03-06 ENCOUNTER — Other Ambulatory Visit: Payer: Self-pay | Admitting: Endocrinology

## 2021-03-06 DIAGNOSIS — E049 Nontoxic goiter, unspecified: Secondary | ICD-10-CM

## 2021-03-25 LAB — HM PAP SMEAR

## 2021-03-25 LAB — RESULTS CONSOLE HPV: CHL HPV: NEGATIVE

## 2021-03-25 LAB — HM MAMMOGRAPHY

## 2021-04-05 IMAGING — US US THYROID
1 series · 13 of 25 positions shown · non-contrast
Comparison: 02/15/2019, 11/22/2017 and 05/30/2008

CLINICAL DATA: Nontoxic goiter. Right thyroid nodule biopsy on
07/12/2008.

EXAM:
THYROID ULTRASOUND
TECHNIQUE: Ultrasound examination of the thyroid gland and adjacent soft
tissues was performed.

[Series 1: us thyroid · 0.06mm/px · 13 of 52 slices shown]
[im 1/52]
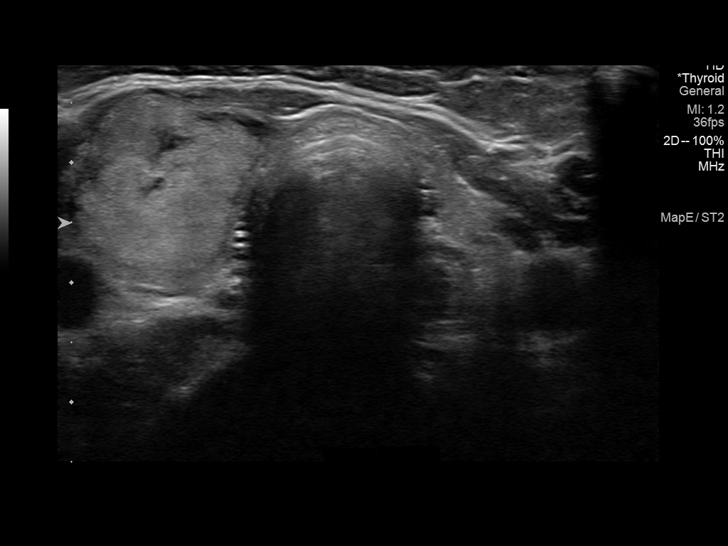
[im 5/52]
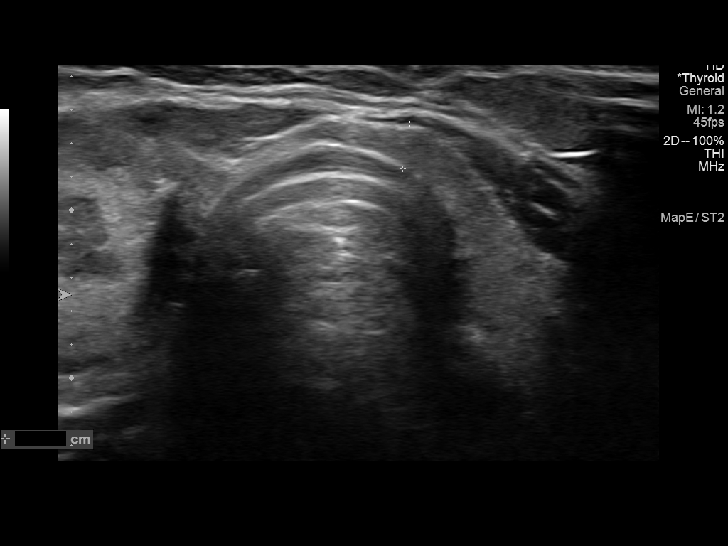
[im 9/52]
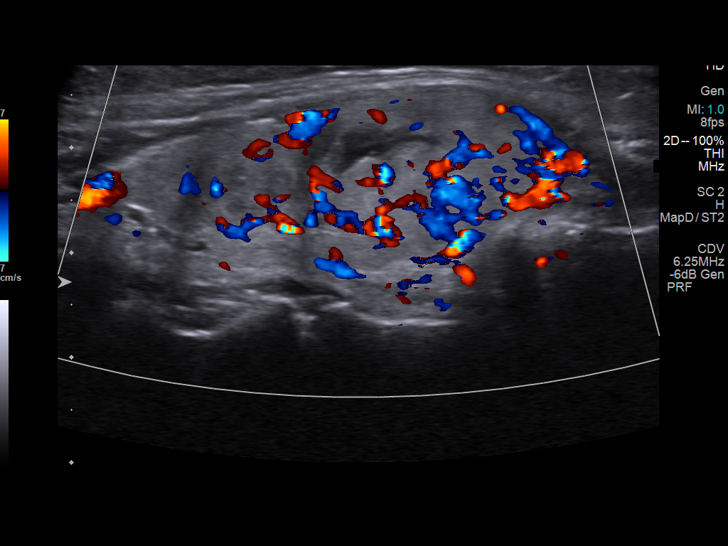
[im 13/52]
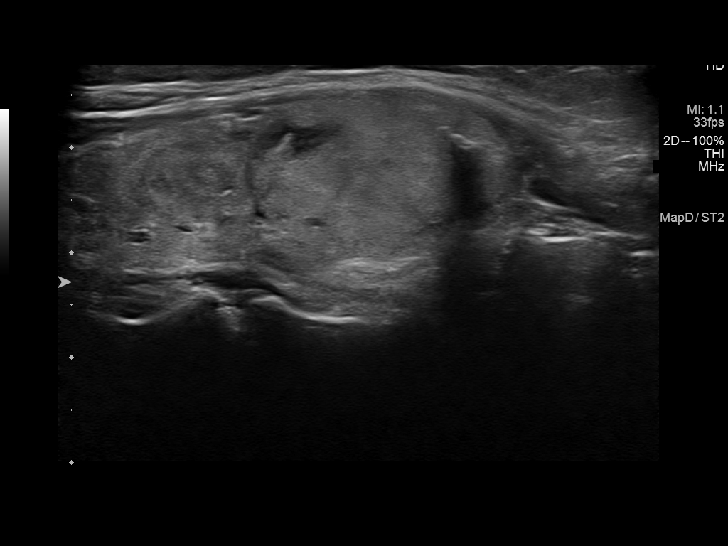
[im 18/52]
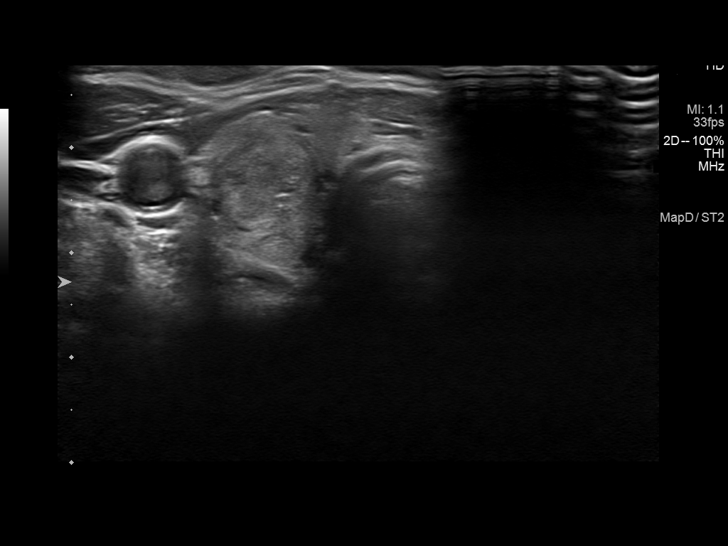
[im 22/52]
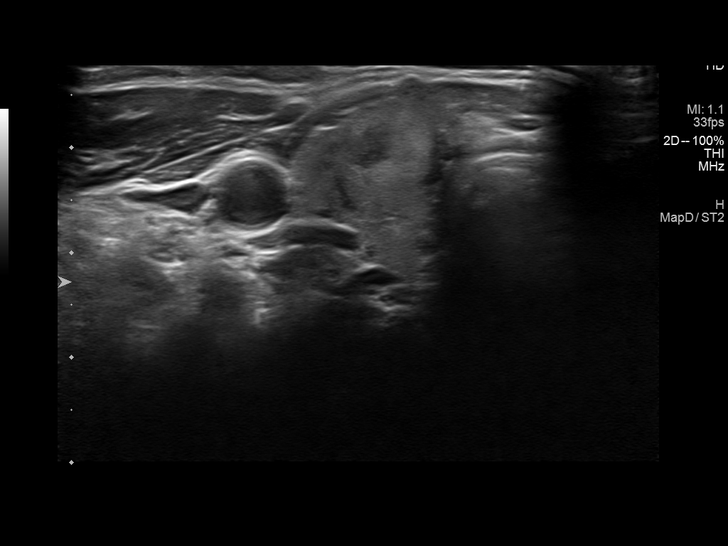
[im 26/52]
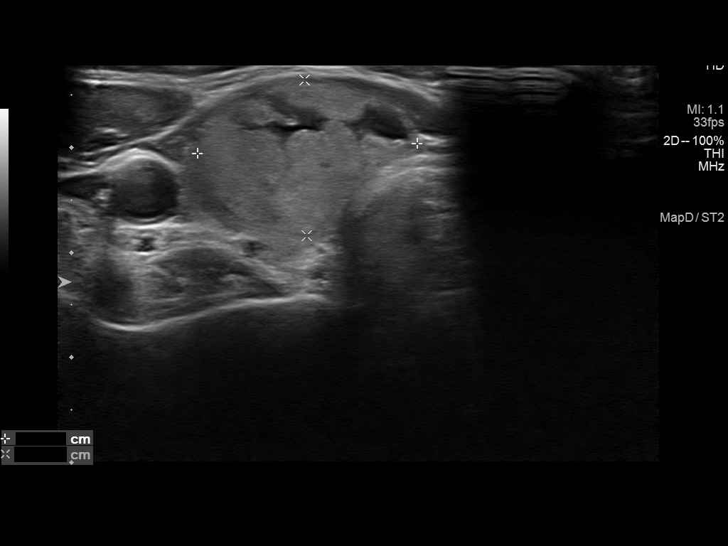
[im 30/52]
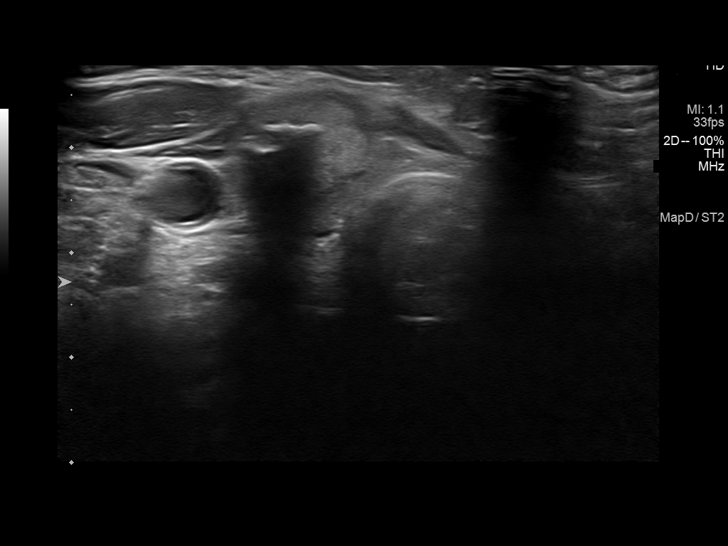
[im 35/52]
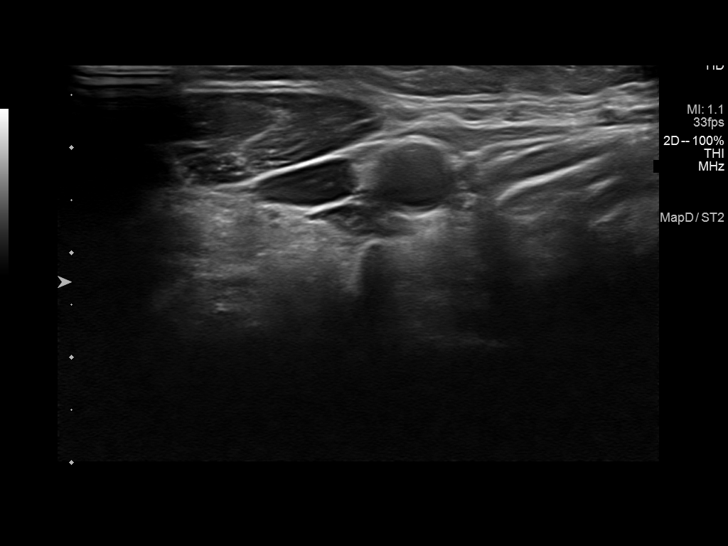
[im 39/52]
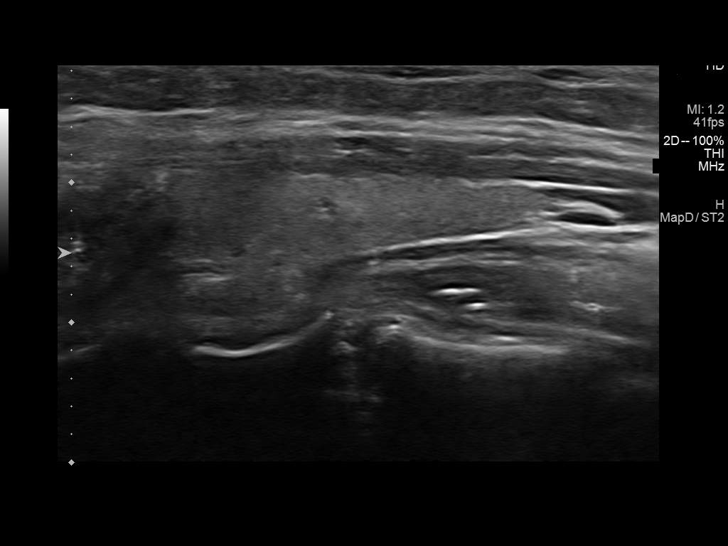
[im 43/52]
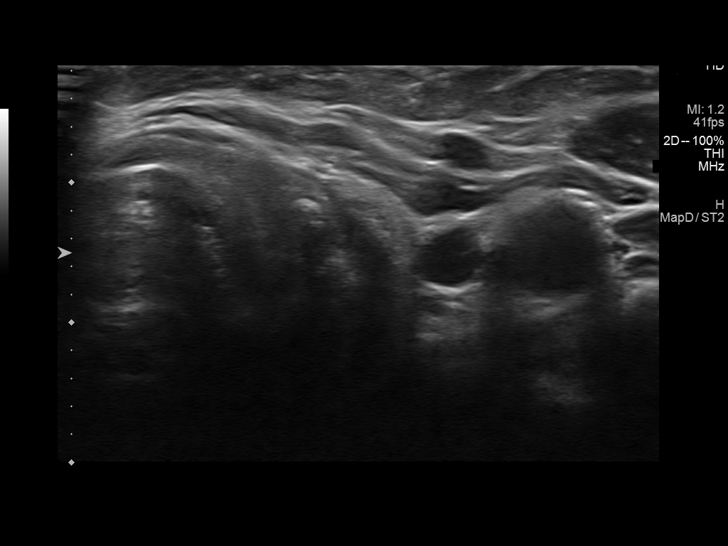
[im 47/52]
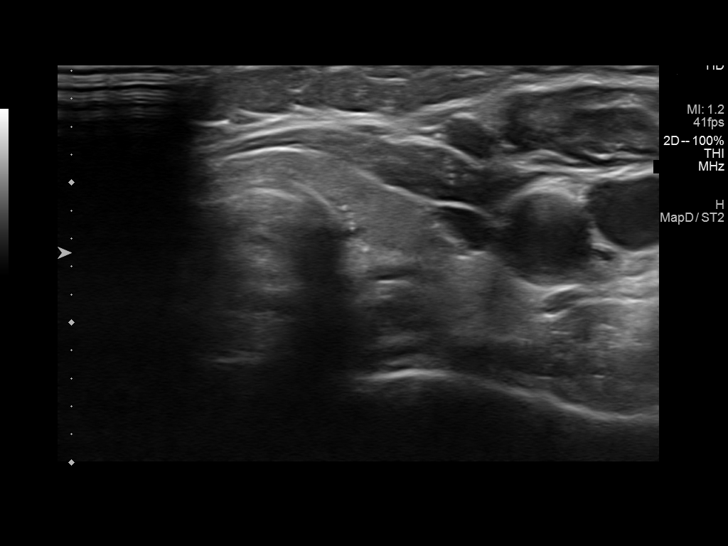
[im 52/52]
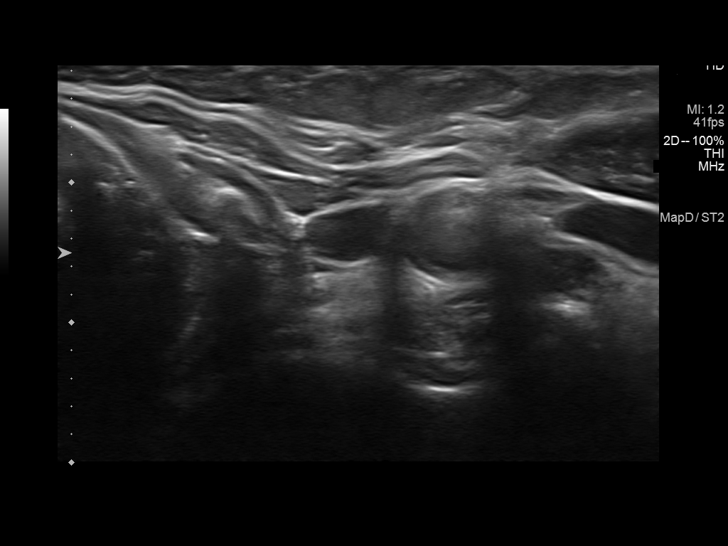

[13 of 25 positions shown; findings below may reference images not displayed]

FINDINGS: Parenchymal Echotexture: Mildly heterogenous

Isthmus: 0.3 cm, previously 0.2 cm

Right lobe: 4.5 x 1.7 x 2.0 cm, previously 4.3 x 1.6 x 1.7 cm

Left lobe: 2.9 x 0.5 x 0.7 cm, previously 3.2 x 0.6 x 0.8 cm

_________________________________________________________

Estimated total number of nodules >/= 1 cm: 2

Number of spongiform nodules >/=  2 cm not described below (TR1): 0

Number of mixed cystic and solid nodules >/= 1.5 cm not described
below (TR2): 0

_________________________________________________________

Nodule 1 in the superior right thyroid lobe is slightly hypoechoic
nodule. Again noted are small echogenic foci or calcifications
associated with this nodule. Nodule 1 measures 1.0 x 0.9 x 1.0 cm
and previously measured 1.2 x 0.7 x 0.9 cm. Nodule has minimally
changed since 9551 and most compatible with a benign nodule.

Nodule 2 in the mid and inferior right thyroid lobe represents the
previously biopsied nodule. This nodule is mildly heterogeneous with
isoechoic solid component. Nodule 2 measures 2.5 x 1.5 x 2.1 cm and
measured 2.7 x 1.7 x 2.3 cm in 5726. Again noted are macro
calcifications along the inferior portion of this nodule.

No discrete left thyroid nodules.

No enlarged lymph nodes around the thyroid tissue.
IMPRESSION: 1. Stable thyroid ultrasound.
2. No significant change in the right thyroid nodules.
3. No new suspicious thyroid nodules.

The above is in keeping with the ACR TI-RADS recommendations - [HOSPITAL] 4194;[DATE].

## 2021-07-07 ENCOUNTER — Other Ambulatory Visit: Payer: Self-pay

## 2021-07-07 ENCOUNTER — Encounter: Payer: Self-pay | Admitting: Family Medicine

## 2021-07-07 ENCOUNTER — Ambulatory Visit: Payer: BC Managed Care – PPO | Admitting: Family Medicine

## 2021-07-07 VITALS — BP 120/80 | HR 68 | Ht 65.0 in | Wt 132.8 lb

## 2021-07-07 DIAGNOSIS — M545 Low back pain, unspecified: Secondary | ICD-10-CM

## 2021-07-07 LAB — POCT URINALYSIS DIP (PROADVANTAGE DEVICE)
Bilirubin, UA: NEGATIVE
Blood, UA: NEGATIVE
Glucose, UA: NEGATIVE mg/dL
Ketones, POC UA: NEGATIVE mg/dL
Leukocytes, UA: NEGATIVE
Nitrite, UA: NEGATIVE
Protein Ur, POC: NEGATIVE mg/dL
Specific Gravity, Urine: 1.005
Urobilinogen, Ur: NEGATIVE
pH, UA: 7.5 (ref 5.0–8.0)

## 2021-07-07 MED ORDER — MELOXICAM 15 MG PO TABS: 15.0000 mg | ORAL_TABLET | Freq: Every day | ORAL | 0 refills | Status: DC

## 2021-07-07 NOTE — Patient Instructions (Addendum)
°  Take meloxicam once daily with food. If it bothers your stomach, cut the dose in half, and/or try pepcid or omeprazole (prilosec) along with the anti-inflammatory. Take it until your pain has resolved (or the full 2 weeks, if needed). Do not take aleve, ibuprofen, Goody/BC while taking meloxicam. You may use any type of tylenol, if needed.  You can continue heat (vs ice, usually icing after activity). You can also continue topical medications, if needed (Biofreeze, Salon Pas (with lidocaine). Consider massaging the muscles after the heat.  I recommend going to [facility] (Dr. Rudene Anda or one of his colleagues). You should be able to call and schedule an appointment yourself. Let us know if I'm incorrect and a referral is needed.  Resume regular yoga (to help with core strengthening and flexibility)--start very cautiously and listen to your body.

## 2021-07-07 NOTE — Progress Notes (Signed)
Chief Complaint  Patient presents with   Back Pain    Lower back pain, middle. Radiates to groin area at time and also L hip. Saw ortho twice for this in Oct 21 and two weeks ago. Was given a prednisone pack and flexeril two weeks ago. Is drinking water, could not provide UA when asked.    Patient presents with complaint of low back pain.  She has had some issues with back pain in the past, recalling seeing Dr. Lynann Bologna in 03/2020, and seen again by a PA at [facility] 2 weeks ago. She recalls getting a shot in her left hip area (she thinks IM), and prescribed cyclobenzaprine.  She was prescribed a steroid dosepak and flexeril 5mg  when seen 2 weeks ago.  She got better, but not completely better. She reports x-rays showed some degeneration in her spine. She is wondering what the next steps are. She had gotten somewhat better, but pain recurred, getting progressively worse. She couldn't sleep last night, and had pain all weekend.  She reports sharp pains when she stands up/moves. She thinks the pain recurred by getting triggered by carrying 2 bags of garbage outside the other day.  She has taken flexeril 5mg  the last 3 nights, still couldn't sleep well last night. She took Aleve, up to 3x/d, sporadically. Didn't bother her stomach. Some pain at her L groin with only certain leg movements. Occasionally has radiated down the lateral thigh.  She denies any numbness, tingling or weakness.  Pain is in the lower back, on both sides equally.  If hip hurts, it is always on the left. She had a fall in 01/2021 (fell onto L hip, from a stepstool height)  She hasn't been doing yoga regularly like she used to.  PMH, PSH, SH reviewed.  Outpatient Encounter Medications as of 07/07/2021  Medication Sig Note   calcium-vitamin D (OSCAL WITH D) 500-200 MG-UNIT tablet Take 1 tablet by mouth daily.    cyclobenzaprine (FLEXERIL) 5 MG tablet Take 5 mg by mouth at bedtime as needed. 07/07/2021: Used the last 3 nights    levothyroxine (SYNTHROID, LEVOTHROID) 88 MCG tablet Take 88 mcg by mouth daily before breakfast.     Multiple Vitamins-Minerals (MULTIVITAMIN ADULTS 50+ PO) Take 1 tablet by mouth daily.    naproxen sodium (ALEVE) 220 MG tablet Take 220 mg by mouth. 07/07/2021: Took last night before bed   acetaminophen (TYLENOL) 325 MG tablet Take 650 mg by mouth every 6 (six) hours as needed. (Patient not taking: Reported on 07/31/2020) 07/07/2021: As needed   fluticasone (FLONASE) 50 MCG/ACT nasal spray Place 2 sprays into both nostrils daily. (Patient not taking: Reported on 07/07/2021) 07/07/2021: Takes as needed   phenylephrine (SUDAFED PE) 10 MG TABS tablet Take 10 mg by mouth every 4 (four) hours as needed. (Patient not taking: Reported on 07/31/2020) 07/07/2021: As needed   predniSONE (STERAPRED UNI-PAK 21 TAB) 10 MG (21) TBPK tablet Take by mouth as directed. (Patient not taking: Reported on 07/07/2021) 07/07/2021: finished   No facility-administered encounter medications on file as of 07/07/2021.   Allergies  Allergen Reactions   Codeine Other (See Comments)    Heart racing/palpitations.   ROS:  no fever, chills, hematuria, dysuria, abdominal pain, URI symptoms, chest pain, shortness of breath, numbness, tingling or other concerns.   PHYSICAL EXAM:  BP 120/80    Pulse 68    Ht 5\' 5"  (1.651 m)    Wt 132 lb 12.8 oz (60.2 kg)  LMP 08/24/2011    BMI 22.10 kg/m   Thin, well-appearing female, in no acute distress, but is noted to move slowly when standing from chair.  HEENT: conjunctiva and sclera are clear, EOMI, wearing mask Neck: no lymphadenopathy Back: area of pain is bilateral lumbar paraspinous muscles.  No tenderness or spasm to palpation in this area. No spinal or CVA tenderness, no SI tenderness, no muscle spasm currently. Heart: regular rate and rhythm Lungs: clear bilaterally Abdomen: soft, nontender, no organomegaly or mass Extremities: no edema. FROM of hips without pain. Nontender at L  trochanteric bursa and IT band. Neuro: alert and oriented.  Normal strength, gait, DTR's.  Negative SLR.  Normal gait. Psych: normal mood, affect, hygiene and grooming  U/a normal.   ASSESSMENT/PLAN:  Acute bilateral low back pain without sciatica - heat, stretches, massage.  NSAID precautions reviewed. Cont flexeril qHS. Rec PT--to contact [facility] for appt - Plan: meloxicam (MOBIC) 15 MG tablet, POCT Urinalysis DIP (Proadvantage Device)  Take meloxicam once daily with food. If it bothers your stomach, cut the dose in half, and/or try pepcid or omeprazole (prilosec) along with the anti-inflammatory. Take it until your pain has resolved (or the full 2 weeks).  You can continue heat (vs ice, usually icing after activity). You can also continue topical medications, if needed (Biofreeze, Salon Pas (with lidocaine). Consider massaging the muscles after the heat.  I recommend going to [facility] (Dr. Rudene Anda or one of his colleagues). You should be able to call and schedule an appointment yourself. Let us know if I'm incorrect and a referral is needed.  Resume regular yoga (to help with core strengthening and flexibility)--start very cautiously and listen to your body.  I spent 33 minutes dedicated to the care of this patient, including pre-visit review of records, face to face time, post-visit ordering of testing and documentation.

## 2021-08-13 ENCOUNTER — Telehealth: Payer: Self-pay | Admitting: Family Medicine

## 2021-08-13 NOTE — Telephone Encounter (Signed)
If anything opens on a Thursday I will call her. Noted.  ?

## 2021-08-13 NOTE — Telephone Encounter (Signed)
Pt called and reschedule her cpe for tomorrow. She likes Thursdays. I rescheduled her to 11/06/2021 in the afternoon. Pt would like to know if Dr. Tomi Bamberger will work her in for an extra cpe. Pt states everything is fine she has no issues but just wanted to check. Sending to [place] to see if anything works.  ?

## 2021-08-14 ENCOUNTER — Encounter: Payer: BC Managed Care – PPO | Admitting: Family Medicine

## 2021-08-26 ENCOUNTER — Other Ambulatory Visit: Payer: Self-pay | Admitting: Family Medicine

## 2021-08-26 DIAGNOSIS — J309 Allergic rhinitis, unspecified: Secondary | ICD-10-CM

## 2021-11-04 DIAGNOSIS — M810 Age-related osteoporosis without current pathological fracture: Secondary | ICD-10-CM | POA: Insufficient documentation

## 2021-11-04 DIAGNOSIS — E042 Nontoxic multinodular goiter: Secondary | ICD-10-CM | POA: Insufficient documentation

## 2021-11-04 DIAGNOSIS — M8588 Other specified disorders of bone density and structure, other site: Secondary | ICD-10-CM | POA: Insufficient documentation

## 2021-11-04 DIAGNOSIS — E559 Vitamin D deficiency, unspecified: Secondary | ICD-10-CM | POA: Insufficient documentation

## 2021-11-04 NOTE — Patient Instructions (Incomplete)
  HEALTH MAINTENANCE RECOMMENDATIONS:  It is recommended that you get at least 30 minutes of aerobic exercise at least 5 days/week (for weight loss, you may need as much as 60-90 minutes). This can be any activity that gets your heart rate up. This can be divided in 10-15 minute intervals if needed, but try and build up your endurance at least once a week.  Weight bearing exercise is also recommended twice weekly.  Eat a healthy diet with lots of vegetables, fruits and fiber.  "Colorful" foods have a lot of vitamins (ie green vegetables, tomatoes, red peppers, etc).  Limit sweet tea, regular sodas and alcoholic beverages, all of which has a lot of calories and sugar.  Up to 1 alcoholic drink daily may be beneficial for women (unless trying to lose weight, watch sugars).  Drink a lot of water.  Calcium recommendations are 1200-1500 mg daily (1500 mg for postmenopausal women or women without ovaries), and vitamin D 1000 IU daily.  This should be obtained from diet and/or supplements (vitamins), and calcium should not be taken all at once, but in divided doses.  Monthly self breast exams and yearly mammograms for women over the age of 76 is recommended.  Sunscreen of at least SPF 30 should be used on all sun-exposed parts of the skin when outside between the hours of 10 am and 4 pm (not just when at beach or pool, but even with exercise, golf, tennis, and yard work!)  Use a sunscreen that says "broad spectrum" so it covers both UVA and UVB rays, and make sure to reapply every 1-2 hours.  Remember to change the batteries in your smoke detectors when changing your clock times in the spring and fall. Carbon monoxide detectors are recommended for your home.  Use your seat belt every time you are in a car, and please drive safely and not be distracted with cell phones and texting while driving.  Be sure to stop the biotin 1-2 weeks before any thyroid blood tests with Dr. Chalmers Cater. Your calcium supplement  should be 4 hours apart from the thyroid medication. Consider taking your vitamins in the evening so avoid any issues.  Try doing the hip stretches shown daily. Wear good shoes and/or a comfortable mat when standing for prolonged periods.

## 2021-11-04 NOTE — Progress Notes (Unsigned)
No chief complaint on file.  Morgan Delacruz is a 61 y.o. female who presents for a complete physical.    She was last seen here in 06/2021 with low back pain. She was treated with meloxicam, flexeril, heat/stretches/massage and encouraged to get PT if not resolving.  Vitamin D level was a little low at 26.7 in 07/2019, when she had been inconsistent with taking her vitamins.  She is currently taking MVI daily.  H/o elevated lipids in the past. They were good on last check in 2021.  She continues to eat a healthy diet. Occasional red meat. 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 Chalmers Cater for multinodular goiter.  Last ultrasound in 03/2020 was stable. She last saw her in 02/2021, had normal TSH. Plan is to f/u with another [place] in 02/2022.   Immunization History  Administered Date(s) Administered   Hepatitis A, Adult 11/25/2016, 07/20/2019   Influenza,inj,quad, With Preservative 01/06/2021   Influenza-Unspecified 03/07/2020   Moderna Covid-19 Vaccine Bivalent Booster 13yr & up 05/06/2021   Moderna Sars-Covid-2 Vaccination 08/18/2019, 09/18/2019, 05/11/2020   Tdap 11/25/2016   Zoster Recombinat (Shingrix) 04/03/2020, 06/05/2020   Last Pap smear: [facility], 02/2018 (that we received, pt states gets yearly), last visit 03/2021, no records received from that visit Last mammogram: 03/2020 at GYN's office--pt states had 03/2021, will request records Last colonoscopy: 04/2020, Dr. MEarlean Shawl  Diverticulosis, internal hemorrhoids Last DEXA:  03/2020 at GYN, osteopenia T-2.3 at spine (decreased from -2.1 in 2021). Dentist: twice a year Ophtho: every other year, went last year (some floaters noted) Exercise: walks most days (gets 5-10K steps daily). Has weights, does some YouTube videos (only occasionally with weights). Does yardwork for parents, lifts heavy items,  walks the dog around their yard 3x/week.   PMH, PSH, SH and FH were reviewed and updated.     ROS:  The patient denies anorexia, fever, vision changes, decreased hearing, ear pain, sore throat, breast concerns, chest pain, palpitations, dizziness, syncope, dyspnea on exertion, cough, swelling, nausea, vomiting, diarrhea, constipation, abdominal pain, melena, hematochezia, indigestion/heartburn, hematuria, incontinence, dysuria, vaginal bleeding, discharge, odor or itch, genital lesions, joint pains, numbness, tingling, weakness, tremor, suspicious skin lesions, depression, abnormal bleeding/bruising, or enlarged lymph nodes. Moods are good, anxiety is well controlled/manageable. Occasional trouble falling asleep (related to anxiety) Some congestion from allergies. LBP?   PHYSICAL EXAM:  LMP 08/24/2011   Wt Readings from Last 3 Encounters:  07/07/21 132 lb 12.8 oz (60.2 kg)  07/31/20 130 lb 9.6 oz (59.2 kg)  07/20/19 126 lb 3.2 oz (57.2 kg)    General Appearance:    Alert, cooperative, no distress, appears stated age  Head:    Normocephalic, without obvious abnormality, atraumatic  Eyes:    PERRL, conjunctiva/corneas clear, EOM's intact, fundi    benign  Ears:    Normal TM's and external ear canals  Nose:   Not examined, wearing mask due to COVID-19 pandemic  Throat:   Not examined, wearing mask due to COVID-19 pandemic  Neck:   Supple, no lymphadenopathy;  thyroid:  No enlargement/ tenderness.  Nodule palpable on R lobe of thyroid, nontender.  no carotid bruit or JVD  Back:    Spine nontender, no curvature, ROM normal, no CVA     tenderness  Lungs:     Clear to auscultation bilaterally without wheezes,  rales or     ronchi; respirations unlabored  Chest Wall:    No tenderness or deformity   Heart:    Regular rate and rhythm, S1 and S2 normal, no murmur, rub   or gallop  Breast Exam:    Deferred to GYN  Abdomen:     Soft, non-tender, nondistended, normoactive bowel sounds,    no  masses, no hepatosplenomegaly  Genitalia:    Deferred to GYN     Extremities:   No clubbing, cyanosis or edema  Pulses:   2+ and symmetric all extremities  Skin:   Skin color, texture, turgor normal, no rashes or lesions (limited exam, didn't change into gown)  Lymph nodes:   Cervical, supraclavicular, and axillary nodes normal  Neurologic:   Normal strength, sensation and gait; reflexes 2+ and symmetric throughout                                Psych:   Normal mood, affect, hygiene and grooming.     *** update nose/throat, if changed into gown   ASSESSMENT/PLAN:  Need records from GYN--mammo from 03/2021, any pap, labs or other records too.  CBC, c-met, lipids, D, ?HIV     Discussed monthly self breast exams and yearly mammograms; at least 30 minutes of aerobic activity at least 5 days/week, weight-bearing exercise at least 2x.wk; proper sunscreen use reviewed; healthy diet, including goals of calcium and vitamin D intake and alcohol recommendations (less than or equal to 1 drink/day) reviewed; regular seatbelt use; changing batteries in smoke detectors.  Immunization recommendations discussed--continue yearly flu shots. Updated bivalent COVID vaccine when available in the Fall. Colonoscopy recommendations reviewed, UTD  F/u 1 year, sooner prn.

## 2021-11-06 ENCOUNTER — Encounter: Payer: Self-pay | Admitting: Family Medicine

## 2021-11-06 ENCOUNTER — Ambulatory Visit: Payer: BC Managed Care – PPO | Admitting: Family Medicine

## 2021-11-06 VITALS — BP 100/60 | HR 60 | Ht 65.0 in | Wt 127.4 lb

## 2021-11-06 DIAGNOSIS — T148XXA Other injury of unspecified body region, initial encounter: Secondary | ICD-10-CM

## 2021-11-06 DIAGNOSIS — M25552 Pain in left hip: Secondary | ICD-10-CM

## 2021-11-06 DIAGNOSIS — Z0000 Encounter for general adult medical examination without abnormal findings: Secondary | ICD-10-CM | POA: Diagnosis not present

## 2021-11-06 DIAGNOSIS — E042 Nontoxic multinodular goiter: Secondary | ICD-10-CM | POA: Diagnosis not present

## 2021-11-06 DIAGNOSIS — E559 Vitamin D deficiency, unspecified: Secondary | ICD-10-CM | POA: Diagnosis not present

## 2021-11-06 DIAGNOSIS — M8588 Other specified disorders of bone density and structure, other site: Secondary | ICD-10-CM | POA: Diagnosis not present

## 2021-11-06 LAB — POCT URINALYSIS DIP (PROADVANTAGE DEVICE)
Bilirubin, UA: NEGATIVE
Blood, UA: NEGATIVE
Glucose, UA: NEGATIVE mg/dL
Ketones, POC UA: NEGATIVE mg/dL
Leukocytes, UA: NEGATIVE
Nitrite, UA: NEGATIVE
Protein Ur, POC: NEGATIVE mg/dL
Specific Gravity, Urine: 1.01
Urobilinogen, Ur: NEGATIVE
pH, UA: 7.5 (ref 5.0–8.0)

## 2021-11-07 LAB — COMPREHENSIVE METABOLIC PANEL
ALT: 17 IU/L (ref 0–32)
AST: 24 IU/L (ref 0–40)
Albumin/Globulin Ratio: 2.4 — ABNORMAL HIGH (ref 1.2–2.2)
Albumin: 4.7 g/dL (ref 3.8–4.8)
Alkaline Phosphatase: 70 IU/L (ref 44–121)
BUN/Creatinine Ratio: 22 (ref 12–28)
BUN: 16 mg/dL (ref 8–27)
Bilirubin Total: 0.4 mg/dL (ref 0.0–1.2)
CO2: 25 mmol/L (ref 20–29)
Calcium: 9.8 mg/dL (ref 8.7–10.3)
Chloride: 103 mmol/L (ref 96–106)
Creatinine, Ser: 0.74 mg/dL (ref 0.57–1.00)
Globulin, Total: 2 g/dL (ref 1.5–4.5)
Glucose: 84 mg/dL (ref 70–99)
Potassium: 4.7 mmol/L (ref 3.5–5.2)
Sodium: 142 mmol/L (ref 134–144)
Total Protein: 6.7 g/dL (ref 6.0–8.5)
eGFR: 91 mL/min/{1.73_m2} (ref >59)

## 2021-11-07 LAB — CBC WITH DIFFERENTIAL/PLATELET
Basophils Absolute: 0.1 10*3/uL (ref 0.0–0.2)
Basos: 2 %
EOS (ABSOLUTE): 0.5 10*3/uL — ABNORMAL HIGH (ref 0.0–0.4)
Eos: 11 %
Hematocrit: 39.5 % (ref 34.0–46.6)
Hemoglobin: 13.2 g/dL (ref 11.1–15.9)
Immature Grans (Abs): 0 10*3/uL (ref 0.0–0.1)
Immature Granulocytes: 0 %
Lymphocytes Absolute: 1.3 10*3/uL (ref 0.7–3.1)
Lymphs: 30 %
MCH: 31.7 pg (ref 26.6–33.0)
MCHC: 33.4 g/dL (ref 31.5–35.7)
MCV: 95 fL (ref 79–97)
Monocytes Absolute: 0.3 10*3/uL (ref 0.1–0.9)
Monocytes: 8 %
Neutrophils Absolute: 2.2 10*3/uL (ref 1.4–7.0)
Neutrophils: 49 %
Platelets: 293 10*3/uL (ref 150–450)
RBC: 4.16 x10E6/uL (ref 3.77–5.28)
RDW: 11.6 % — ABNORMAL LOW (ref 11.7–15.4)
WBC: 4.3 10*3/uL (ref 3.4–10.8)

## 2021-11-07 LAB — LIPID PANEL
Chol/HDL Ratio: 2.5 ratio (ref 0.0–4.4)
Cholesterol, Total: 209 mg/dL — ABNORMAL HIGH (ref 100–199)
HDL: 84 mg/dL (ref >39)
LDL Chol Calc (NIH): 116 mg/dL — ABNORMAL HIGH (ref 0–99)
Triglycerides: 52 mg/dL (ref 0–149)
VLDL Cholesterol Cal: 9 mg/dL (ref 5–40)

## 2021-11-07 LAB — VITAMIN D 25 HYDROXY (VIT D DEFICIENCY, FRACTURES): Vit D, 25-Hydroxy: 41.6 ng/mL (ref 30.0–100.0)

## 2021-11-20 ENCOUNTER — Encounter: Payer: Self-pay | Admitting: Family Medicine

## 2022-01-16 ENCOUNTER — Telehealth: Payer: Self-pay | Admitting: Family Medicine

## 2022-01-16 ENCOUNTER — Other Ambulatory Visit: Payer: Self-pay

## 2022-01-16 DIAGNOSIS — M25552 Pain in left hip: Secondary | ICD-10-CM

## 2022-01-16 NOTE — Telephone Encounter (Signed)
Pt called and states that her left hip is still bothering her, she has been taking tyneol to try and help the pain and trying to do yoga stretches, she is wanting to know if you recommend PT, or her go to a ortho dr  Inofmred her that you was out of the office today and it would be later today before you read your messages Please send back to someone else after 12.30 as I leave and will not be in the office next week

## 2022-01-19 ENCOUNTER — Other Ambulatory Visit: Payer: Self-pay | Admitting: Endocrinology

## 2022-01-19 DIAGNOSIS — E049 Nontoxic goiter, unspecified: Secondary | ICD-10-CM

## 2022-01-21 NOTE — Progress Notes (Unsigned)
I, Morgan Delacruz, LAT, ATC acting as a scribe for Morgan Leader, MD.  Subjective:    CC: L hip and low back pain  HPI: Pt is a 61 y/o female c/o L hip and low back pain. Pt suffered a fall in Nov 2022 and wonders if this incident trigger this pain. Pt locates pain to L groin and L-side of her low back  Radiating pain: yes- along lateral aspect of the proximal L thigh LE numbness/tingling: no LE weakness: no Aggravates: prolonged standing,  Treatments tried: meloxicam, flexeril, stretches, massage, cushioned mat to stand on in the kitchen  Pertinent review of Systems: No fevers or chills  Relevant historical information: Osteopenia   Objective:    Vitals:   01/22/22 0802  BP: 104/72  Pulse: 61  SpO2: 100%   General: Well Developed, well nourished, and in no acute distress.   MSK: L-spine: Normal appearing Normal motion.  Some pain with rotation and lateral flexion. Nontender midline and paraspinal musculature. Lower extremity strength reflexes and sensation are intact except noted below. Negative slump test.  Left hip: Normal-appearing Normal motion.. Tender palpation greater trochanter. Hip abduction strength decreased 4/5 with pain.  External rotation strength slightly decreased 4/5 with some pain.   Right hip: Normal-appearing Normal motion. Nontender. Strength slightly decreased abduction and external rotation 4/5.  Leg lengths are equal.  Lab and Radiology Results  X-ray images L-spine and left hip obtained today personally and independently interpreted  L-spine: Mild scoliotic change with curve convex right.  Degenerative changes and anterior listhesis at L2-3.  DDD and facet DJD are worse at L4-5 and L5-S1.  No acute fractures are present.  Left hip: No acute fractures.  No severe degenerative changes visible.  Await formal radiology review    Impression and Recommendations:    Assessment and Plan: 61 y.o. female with  Chronic left low back  pain with pain in the left lateral and anterior hip.  Pain is multifactorial.  The low back pain is thought to be due to muscle spasm and dysfunction.  She definitely has some degenerative changes seen on x-ray which could be contributory as well.  The lateral hip pain is thought to be hip abductor tendinopathy or trochanteric bursitis.  Both of these issues could be better controlled with physical therapy.  Plan for trial of PT.  The anterior hip pain is more typical for intra-articular cause.  X-ray per my read does not show significant degenerative changes but labrum tear is certainly a possibility.  If not improving with physical therapy consider MRI arthrogram.  Recheck in 6 weeks.Marland Kitchen  PDMP not reviewed this encounter. Orders Placed This Encounter  Procedures   DG Lumbar Spine 2-3 Views    Standing Status:   Future    Number of Occurrences:   1    Standing Expiration Date:   02/22/2022    Order Specific Question:   Reason for Exam (SYMPTOM  OR DIAGNOSIS REQUIRED)    Answer:   low back pain    Order Specific Question:   Preferred imaging location?    Answer:   [name]   DG HIP UNILAT W OR W/O PELVIS 2-3 VIEWS LEFT    Standing Status:   Future    Number of Occurrences:   1    Standing Expiration Date:   02/22/2022    Order Specific Question:   Reason for Exam (SYMPTOM  OR DIAGNOSIS REQUIRED)    Answer:   left hip pain  Order Specific Question:   Preferred imaging location?    Answer:   [name]   Ambulatory referral to Physical Therapy    Referral Priority:   Routine    Referral Type:   Physical Medicine    Referral Reason:   Specialty Services Required    Requested Specialty:   Physical Therapy    Number of Visits Requested:   1   No orders of the defined types were placed in this encounter.   Discussed warning signs or symptoms. Please see discharge instructions. Patient expresses understanding.   The above documentation has been reviewed and is  accurate and complete Morgan Delacruz, M.D.

## 2022-01-22 ENCOUNTER — Ambulatory Visit (INDEPENDENT_AMBULATORY_CARE_PROVIDER_SITE_OTHER): Payer: BC Managed Care – PPO

## 2022-01-22 ENCOUNTER — Ambulatory Visit: Payer: BC Managed Care – PPO | Admitting: Family Medicine

## 2022-01-22 VITALS — BP 104/72 | HR 61 | Ht 65.0 in | Wt 129.4 lb

## 2022-01-22 DIAGNOSIS — M25552 Pain in left hip: Secondary | ICD-10-CM | POA: Diagnosis not present

## 2022-01-22 DIAGNOSIS — G8929 Other chronic pain: Secondary | ICD-10-CM | POA: Diagnosis not present

## 2022-01-22 DIAGNOSIS — M545 Low back pain, unspecified: Secondary | ICD-10-CM

## 2022-01-22 NOTE — Patient Instructions (Addendum)
Thank you for coming in today.   Please get an Xray today before you leave   I've referred you to Physical Therapy.  Let us know if you don't hear from them in one week.   Check back in 6 weeks 

## 2022-01-26 NOTE — Progress Notes (Signed)
Left hip x-ray shows known hip arthritis or significant hip joint problems.  There is a little bit of arthritis at the SI joint at the base of your spine.

## 2022-01-26 NOTE — Progress Notes (Signed)
Lumbar spine x-ray shows some arthritis based spine and a little bit of scoliosis curvature.

## 2022-01-27 ENCOUNTER — Ambulatory Visit
Admission: RE | Admit: 2022-01-27 | Discharge: 2022-01-27 | Disposition: A | Discharge status: Home / Self Care | Payer: BC Managed Care – PPO | Source: Ambulatory Visit | Attending: Endocrinology | Admitting: Endocrinology

## 2022-01-27 DIAGNOSIS — E049 Nontoxic goiter, unspecified: Secondary | ICD-10-CM

## 2022-02-03 ENCOUNTER — Ambulatory Visit: Payer: BC Managed Care – PPO | Admitting: Physical Therapy

## 2022-02-03 ENCOUNTER — Encounter: Payer: Self-pay | Admitting: Physical Therapy

## 2022-02-03 DIAGNOSIS — R29898 Other symptoms and signs involving the musculoskeletal system: Secondary | ICD-10-CM

## 2022-02-03 DIAGNOSIS — M5459 Other low back pain: Secondary | ICD-10-CM | POA: Diagnosis not present

## 2022-02-03 DIAGNOSIS — M6281 Muscle weakness (generalized): Secondary | ICD-10-CM | POA: Diagnosis not present

## 2022-02-03 NOTE — Therapy (Signed)
OUTPATIENT PHYSICAL THERAPY THORACOLUMBAR EVALUATION   Patient Name: Morgan Delacruz MRN: [id number] DOB:[birth year], 61 y.o., female Today's Date: 02/03/2022   PT End of Session - 02/03/22 0836     Visit Number 1    Number of Visits 7    Date for PT Re-Evaluation 03/17/22    Authorization Type BC[employer]    Authorization Time Period 02/03/22 to 03/17/22    PT Start Time 0802    PT Stop Time 0842    PT Time Calculation (min) 40 min    Activity Tolerance Patient tolerated treatment well    Behavior During Therapy [facility] for tasks assessed/performed             Past Medical History:  Diagnosis Date   [place] illness 08/2018   [place]   BCC (basal cell carcinoma), face 09/2015   nose, s/p Mohs   Generalized anxiety disorder    Hypothyroidism    followed by Dr. Chalmers Cater   Multinodular goiter    Dr. Chalmers Cater   Osteopenia    per DEXA per GYN   Past Surgical History:  Procedure Laterality Date   MOHS SURGERY     [place] on nose--Dr. Sarajane Jews   SKIN SURGERY     R forehead, for cancer (prior to 2019)   Patient Active Problem List   Diagnosis Date Noted   Osteopenia of lumbar spine 11/04/2021   Vitamin D deficiency 11/04/2021   Multinodular goiter 11/04/2021   Anxiety state, unspecified 09/07/2011   LBP (low back pain) 09/07/2011    PCP: Rita Ohara  REFERRING PROVIDER: Gregor Hams, MD   REFERRING DIAG: M54.50,G89.29 (ICD-10-CM) - Chronic left-sided low back pain without sciatica M25.552,G89.29 (ICD-10-CM) - Chronic left hip pain   Rationale for Evaluation and Treatment Rehabilitation  THERAPY DIAG:  Other low back pain  Muscle weakness (generalized)  Other symptoms and signs involving the musculoskeletal system  ONSET DATE: 01/22/2022   SUBJECTIVE:                                                                                                                                                                                           SUBJECTIVE STATEMENT: When I  stand (have to stand a lot for cooking in the kitchen) it feels like I'm getting a knife in my back and hips; last November I was helping someone move, climbed a ladder and was holding onto the molding and I fell pretty hard  onto my left hip. Since then I can't sleep on my left side, its also a constant ache when I'm standing a lot or doing a lot of work out in the  yard. I don't have numbness in my legs but I do get short spurts of shooting pain down the front and side of my thigh to about halfway down. I've been trying to do some hip mobility classes, I walk and try to stay active, I've tried online yoga classes too. I also do circuit classes with cardio and weight, walk regularly too.   PERTINENT HISTORY:  Assessment and Plan: 61 y.o. female with  Chronic left low back pain with pain in the left lateral and anterior hip.  Pain is multifactorial.   The low back pain is thought to be due to muscle spasm and dysfunction.  She definitely has some degenerative changes seen on x-ray which could be contributory as well.   The lateral hip pain is thought to be hip abductor tendinopathy or trochanteric bursitis.   Both of these issues could be better controlled with physical therapy.  Plan for trial of PT.   The anterior hip pain is more typical for intra-articular cause.  X-ray per my read does not show significant degenerative changes but labrum tear is certainly a possibility.  If not improving with physical therapy consider MRI arthrogram.   Recheck in 6 weeks.Marland Kitchen  PAIN:  Are you having pain? Yes: NPRS scale: 2/10, to 6/10 at worst and time to calm down varies/depends on activity and activity levels I am doing  Pain location: L hip and low back  Pain description: irritating and sore, constant pain   Aggravating factors: standing too long, being up on feet working too long  Relieving factors: sitting down, getting off of feet, tylenol, stretching    PRECAUTIONS: None  WEIGHT BEARING  RESTRICTIONS No  FALLS:  Has patient fallen in last 6 months? No  LIVING ENVIRONMENT: Lives with: lives with their spouse Lives in: House/apartment Stairs: 2 STE  with U rail, inside 10-12 U rail  Has following equipment at home: None  OCCUPATION: retired   PLOF: Independent, Independent with basic ADLs, Independent with gait, and Independent with transfers  PATIENT GOALS figure out cause of pain, stop pain, improve QOL    OBJECTIVE:   DIAGNOSTIC FINDINGS:  IMPRESSION: 1. Unremarkable radiographic appearance of the left hip. 2. Mild bilateral sacroiliac joint degenerative change.  IMPRESSION: 1. Mild multilevel degenerative disc disease. L5-S1 facet hypertrophy. 2. Mild broad-based dextroscoliotic curvature.    PATIENT SURVEYS:  FOTO 66  SCREENING FOR RED FLAGS: Bowel or bladder incontinence: No Spinal tumors: No Cauda equina syndrome: No Compression fracture: No Abdominal aneurysm: No  COGNITION:  Overall cognitive status: Within functional limits for tasks assessed     SENSATION: Not tested  MUSCLE LENGTH: Quads mild limitation B, hip flexors mild limitation B, R HS and piriformis WNL, L HS and piriformis mild limitation   POSTURE: rounded shoulders, forward head, and decreased lumbar lordosis  PALPATION: No areas specifically TTP   LUMBAR ROM:   Active  A/PROM  eval  Flexion WNL   Extension WNL   Right lateral flexion WNL tightness on L   Left lateral flexion WNL   Right rotation   Left rotation    (Blank rows = not tested)  LOWER EXTREMITY ROM:     Active  Right eval Left eval  Hip flexion 5 5  Hip extension 3+ 3+  Hip abduction 4+ 3+  Hip adduction    Hip internal rotation    Hip external rotation    Knee flexion 5 5  Knee extension 5 5  Ankle dorsiflexion 5 5  Ankle plantarflexion    Ankle inversion    Ankle eversion     (Blank rows = not tested)  LOWER EXTREMITY MMT:    MMT Right eval Left eval  Hip flexion    Hip  extension    Hip abduction    Hip adduction    Hip internal rotation    Hip external rotation    Knee flexion    Knee extension    Ankle dorsiflexion    Ankle plantarflexion    Ankle inversion    Ankle eversion     (Blank rows = not tested)  LUMBAR SPECIAL TESTS:  Straight leg raise test: Negative, FABER test: Positive L, and hip scour test positive L , no LLD noted, immediate fail on double leg lower test indicating poor core strength   FUNCTIONAL TESTS:    GAIT:    TODAY'S TREATMENT   Nustep L5 x6 minutes BLEs at least a 70spm pace   Bridges with ABD red TB 1x15 3 second holds  PPT 1x15 3 second holds  Sidelying hip ABD 1x10 red TB above knees  Quadruped hydrants red TB x10 B     PATIENT EDUCATION:  Education details: exam findings, POC, HEP  Person educated: Patient Education method: Explanation, Demonstration, and Handouts Education comprehension: verbalized understanding and returned demonstration   HOME EXERCISE PROGRAM: [email or web address]  ASSESSMENT:  CLINICAL IMPRESSION: Patient is a 61 y.o. F who was seen today for physical therapy evaluation and treatment for low back and L hip pain. Exam reveals some functional mm weakness in her hips and core, as well as mild postural impairments and flexibility impairments. Of note, does flag positive for interarticular testing on L hip specifically. Will benefit from skilled PT services to address functional limitations, reduce pain, and improve overall level of function.    OBJECTIVE IMPAIRMENTS decreased mobility, difficulty walking, decreased strength, increased muscle spasms, impaired flexibility, improper body mechanics, postural dysfunction, and pain.   ACTIVITY LIMITATIONS carrying, lifting, standing, stairs, transfers, and locomotion level  PARTICIPATION LIMITATIONS: meal prep, cleaning, laundry, shopping, community activity, and yard work  PERSONAL FACTORS Age, Behavior pattern, Fitness, Past/current experiences,  and Time since onset of injury/illness/exacerbation are also affecting patient's functional outcome.   REHAB POTENTIAL: Good  CLINICAL DECISION MAKING: Stable/uncomplicated  EVALUATION COMPLEXITY: Low   GOALS: Goals reviewed with patient? Yes  SHORT TERM GOALS: Target date: 02/24/2022  Will be compliant with appropriate progressive HEP  Baseline: Goal status: INITIAL  2.  Pain to be no more than 4/10 at worst  Baseline:  Goal status: INITIAL  3.  Will be able to tolerate standing tasks at least 60 minutes in duration without increase in pain  Baseline:  Goal status: INITIAL  4.  Will demonstrate improved understanding of posture and ergonomics  Baseline:  Goal status: INITIAL    LONG TERM GOALS: Target date: 03/17/2022  MMT to improve by one grade in all weak groups  Baseline:  Goal status: INITIAL  2.  Will be able to perform standing tasks for at least 2 hours with pain no more than 2/10 Baseline:  Goal status: INITIAL  3.  Will demonstrate correct functional mechanics for floor to waist height lifting to prevent exacerbating pain  Baseline:  Goal status: INITIAL  4.  Will demonstrate improved functional core strength by passing double leg lower test  Baseline:  Goal status: INITIAL   PLAN: PT FREQUENCY: 1x/week  PT DURATION: 6 weeks  PLANNED INTERVENTIONS: Therapeutic exercises, Therapeutic activity, Neuromuscular re-education,  Balance training, Gait training, Patient/Family education, Self Care, Joint mobilization, Stair training, Dry Needling, Electrical stimulation, Spinal mobilization, Cryotherapy, Moist heat, Taping, Ultrasound, Ionotophoresis '4mg'$ /ml Dexamethasone, Manual therapy, and Re-evaluation.  PLAN FOR NEXT SESSION: core and hip strength, postural strength and ergonomics; update HEP each session- 1x/week due to high co-pay    Timmie Dugue U PT DPT PN2  02/03/2022, 8:46 AM

## 2022-02-11 ENCOUNTER — Encounter: Payer: Self-pay | Admitting: Internal Medicine

## 2022-02-17 ENCOUNTER — Encounter: Payer: Self-pay | Admitting: Physical Therapy

## 2022-02-17 ENCOUNTER — Ambulatory Visit: Payer: BC Managed Care – PPO | Admitting: Physical Therapy

## 2022-02-17 DIAGNOSIS — M6281 Muscle weakness (generalized): Secondary | ICD-10-CM | POA: Diagnosis not present

## 2022-02-17 DIAGNOSIS — R29898 Other symptoms and signs involving the musculoskeletal system: Secondary | ICD-10-CM | POA: Diagnosis not present

## 2022-02-17 DIAGNOSIS — M5459 Other low back pain: Secondary | ICD-10-CM

## 2022-02-17 NOTE — Therapy (Signed)
OUTPATIENT PHYSICAL THERAPY TREATMENT NOTE   Patient Name: Morgan Delacruz MRN: [id number] DOB:[birth year], 61 y.o., female Today's Date: 02/17/2022  PCP: Rita Ohara, MD REFERRING PROVIDER: Gregor Hams, MD   END OF SESSION:   PT End of Session - 02/17/22 0855     Visit Number 2    Number of Visits 7    Date for PT Re-Evaluation 03/17/22    Authorization Type [employer]    Authorization Time Period 02/03/22 to 03/17/22    PT Start Time 0803    PT Stop Time 0845    PT Time Calculation (min) 42 min    Activity Tolerance Patient tolerated treatment well    Behavior During Therapy [facility] for tasks assessed/performed             Past Medical History:  Diagnosis Date   [place] illness 08/2018   [place]   BCC (basal cell carcinoma), face 09/2015   nose, s/p Mohs   Generalized anxiety disorder    Hypothyroidism    followed by Dr. Chalmers Cater   Multinodular goiter    Dr. Chalmers Cater   Osteopenia    per DEXA per GYN   Past Surgical History:  Procedure Laterality Date   MOHS SURGERY     BCC on nose--Dr. Sarajane Jews   SKIN SURGERY     R forehead, for cancer (prior to 2019)   Patient Active Problem List   Diagnosis Date Noted   Osteopenia of lumbar spine 11/04/2021   Vitamin D deficiency 11/04/2021   Multinodular goiter 11/04/2021   Anxiety state, unspecified 09/07/2011   LBP (low back pain) 09/07/2011    REFERRING DIAG: M54.50,G89.29 (ICD-10-CM) - Chronic left-sided low back pain without sciatica M25.552,G89.29 (ICD-10-CM) - Chronic left hip pain   THERAPY DIAG:  Other low back pain  Muscle weakness (generalized)  Other symptoms and signs involving the musculoskeletal system  Rationale for Evaluation and Treatment Rehabilitation  PERTINENT HISTORY: 61 y.o. female with  Chronic left low back pain with pain in the left lateral and anterior hip.  Pain is multifactorial.   The low back pain is thought to be due to muscle spasm and dysfunction.  She definitely has some  degenerative changes seen on x-ray which could be contributory as well.   The lateral hip pain is thought to be hip abductor tendinopathy or trochanteric bursitis.   Both of these issues could be better controlled with physical therapy.  Plan for trial of PT.   The anterior hip pain is more typical for intra-articular cause.  X-ray per my read does not show significant degenerative changes but labrum tear is certainly a possibility.  If not improving with physical therapy consider MRI arthrogram.   Recheck in 6 weeks.Marland Kitchen    PRECAUTIONS: none  SUBJECTIVE: Pt stating she went to the beach this past weekend and sitting in the car or standing prolonged can make it worse.   PAIN:  Are you having pain? Yes, 3/10     OBJECTIVE: (objective measures completed at initial evaluation unless otherwise dated)   DIAGNOSTIC FINDINGS:  IMPRESSION: 1. Unremarkable radiographic appearance of the left hip. 2. Mild bilateral sacroiliac joint degenerative change.   IMPRESSION: 1. Mild multilevel degenerative disc disease. L5-S1 facet hypertrophy. 2. Mild broad-based dextroscoliotic curvature.     PATIENT SURVEYS:  FOTO 66   SCREENING FOR RED FLAGS: Bowel or bladder incontinence: No Spinal tumors: No Cauda equina syndrome: No Compression fracture: No Abdominal aneurysm: No   COGNITION:  Overall cognitive status: Within functional limits for tasks assessed                          SENSATION: Not tested   MUSCLE LENGTH: Quads mild limitation B, hip flexors mild limitation B, R HS and piriformis WNL, L HS and piriformis mild limitation    POSTURE: rounded shoulders, forward head, and decreased lumbar lordosis   PALPATION: No areas specifically TTP    LUMBAR ROM:    Active  A/PROM  eval  Flexion WNL   Extension WNL   Right lateral flexion WNL tightness on L   Left lateral flexion WNL   Right rotation    Left rotation     (Blank rows = not tested)   LOWER EXTREMITY  MMT:       Active  Right eval Left eval  Hip flexion 5 5  Hip extension 3+ 3+  Hip abduction 4+ 3+  Hip adduction      Hip internal rotation      Hip external rotation      Knee flexion 5 5  Knee extension 5 5  Ankle dorsiflexion 5 5  Ankle plantarflexion      Ankle inversion      Ankle eversion       (Blank rows = not tested)   LOWER EXTREMITY ROM   ROM Right  Left  Hip flexion      Hip extension      Hip abduction      Hip adduction      Hip internal rotation      Hip external rotation      Knee flexion      Knee extension      Ankle dorsiflexion      Ankle plantarflexion      Ankle inversion      Ankle eversion       (Blank rows = not tested)   LUMBAR SPECIAL TESTS:  Straight leg raise test: Negative, FABER test: Positive L, and hip scour test positive L , no LLD noted, immediate fail on double leg lower test indicating poor core strength    FUNCTIONAL TESTS:      GAIT:       TODAY'S TREATMENT  02/17/22:  TherEx:  Nustep: L5 x 6 minutes Hip flexor stretch x 2 each LE off side of mat table c strap for added quad stretch holding 30 sec Figure 4 hip stretch x 3 on Left LE and x 2 on Rt LE holding 30 sec Piriformis stretch: x 2 each  LE holding 30 sec PPT x5 holding 5 sec Bridge with feet on green physioball x 10 holding 5 sec Quadraped: opposite arm/leg c tuck x 10 each side Standing on yoga block dropping hip for toe touch Muscle energy techniques using cane x 5 each side Manual:  STM to lumbar paraspinals left piriformis    02/03/22 Nustep L5 x6 minutes BLEs at least a 70spm pace    Bridges with ABD red TB 1x15 3 second holds  PPT 1x15 3 second holds  Sidelying hip ABD 1x10 red TB above knees  Quadruped hydrants red TB x10 B         PATIENT EDUCATION:  Education details: exam findings, POC, HEP  Person educated: Patient Education method: Explanation, Demonstration, and Handouts Education comprehension: verbalized understanding and returned  demonstration     HOME EXERCISE PROGRAM: Access Code: [email or web address] URL: [email or web address] Date: 02/17/2022 Prepared by:  Kearney Hard  Exercises - Supine Posterior Pelvic Tilt  - 2 x daily - 7 x weekly - 1 sets - 15 reps - 3 hold - Supine Bridge with Resistance Band  - 2 x daily - 7 x weekly - 1 sets - 15 reps - 3 hold - Hydrant with Resistance  - 2 x daily - 7 x weekly - 1 sets - 10 reps - 1 hold - Sidelying Hip Abduction with Resistance at Thighs  - 2 x daily - 7 x weekly - 1 sets - 10 reps - 2 hold - Supine Figure 4 Piriformis Stretch  - 2 x daily - 7 x weekly - 3 reps - 30 seconds hold - Supine Figure 4 Piriformis Stretch  - 2 x daily - 7 x weekly - 3 reps - 30 seconds hold - Supine Quadriceps Stretch with Strap on Table  - 2 x daily - 7 x weekly - 3 reps - 30 seconds hold - Pelvic Muscle Energy Technique With Flexion and Extension  - 7 x weekly - 3 sets - 10 reps    ASSESSMENT:   CLINICAL IMPRESSION: Pt arriving to PT reporting 3/10 pain in her left side low back and left hip. Pt reporting compliance in her HEP. Pt able to return demonstration for all current exercises. I added gluteal, hip flexor, and piriformis stretching to pt's HEP. Treatment focusing on hip strength and core stability. Continue skilled PT.      OBJECTIVE IMPAIRMENTS decreased mobility, difficulty walking, decreased strength, increased muscle spasms, impaired flexibility, improper body mechanics, postural dysfunction, and pain.    ACTIVITY LIMITATIONS carrying, lifting, standing, stairs, transfers, and locomotion level   PARTICIPATION LIMITATIONS: meal prep, cleaning, laundry, shopping, community activity, and yard work   PERSONAL FACTORS Age, Behavior pattern, Fitness, Past/current experiences, and Time since onset of injury/illness/exacerbation are also affecting patient's functional outcome.    REHAB POTENTIAL: Good   CLINICAL DECISION MAKING: Stable/uncomplicated   EVALUATION  COMPLEXITY: Low     GOALS: Goals reviewed with patient? Yes   SHORT TERM GOALS: Target date: 02/24/2022   Will be compliant with appropriate progressive HEP  Baseline: Goal status: on-going 02/17/22   2.  Pain to be no more than 4/10 at worst  Baseline:  Goal status: on-going 02/17/22   3.  Will be able to tolerate standing tasks at least 60 minutes in duration without increase in pain  Baseline:  Goal status: INITIAL   4.  Will demonstrate improved understanding of posture and ergonomics  Baseline:  Goal status: INITIAL       LONG TERM GOALS: Target date: 03/17/2022   MMT to improve by one grade in all weak groups  Baseline:  Goal status: INITIAL   2.  Will be able to perform standing tasks for at least 2 hours with pain no more than 2/10 Baseline:  Goal status: INITIAL   3.  Will demonstrate correct functional mechanics for floor to waist height lifting to prevent exacerbating pain  Baseline:  Goal status: INITIAL   4.  Will demonstrate improved functional core strength by passing double leg lower test  Baseline:  Goal status: INITIAL     PLAN: PT FREQUENCY: 1x/week   PT DURATION: 6 weeks   PLANNED INTERVENTIONS: Therapeutic exercises, Therapeutic activity, Neuromuscular re-education, Balance training, Gait training, Patient/Family education, Self Care, Joint mobilization, Stair training, Dry Needling, Electrical stimulation, Spinal mobilization, Cryotherapy, Moist heat, Taping, Ultrasound, Ionotophoresis '4mg'$ /ml Dexamethasone, Manual therapy, and Re-evaluation.   PLAN FOR  NEXT SESSION: Continue core and hip strength, postural strength, hip mobility exercises,  update HEP as needed. 1x/week due to high co-pay  Try quadraped hip strengthening     Oretha Caprice, PT, MPT 02/17/2022, 9:00 AM

## 2022-02-24 ENCOUNTER — Encounter: Payer: BC Managed Care – PPO | Admitting: Physical Therapy

## 2022-02-26 ENCOUNTER — Encounter: Payer: Self-pay | Admitting: Physical Therapy

## 2022-02-26 ENCOUNTER — Ambulatory Visit: Payer: BC Managed Care – PPO | Admitting: Physical Therapy

## 2022-02-26 DIAGNOSIS — M5459 Other low back pain: Secondary | ICD-10-CM | POA: Diagnosis not present

## 2022-02-26 DIAGNOSIS — M6281 Muscle weakness (generalized): Secondary | ICD-10-CM

## 2022-02-26 DIAGNOSIS — R29898 Other symptoms and signs involving the musculoskeletal system: Secondary | ICD-10-CM

## 2022-02-26 NOTE — Therapy (Signed)
OUTPATIENT PHYSICAL THERAPY TREATMENT NOTE   Patient Name: Morgan ARSCOTT MRN: [id number] DOB:1960-09-14, 61 y.o., female Today's Date: 02/26/2022  PCP: Rita Ohara, MD REFERRING PROVIDER: Gregor Hams, MD   END OF SESSION:   PT End of Session - 02/26/22 1114     Visit Number 3    Number of Visits 7    Date for PT Re-Evaluation 03/17/22    Authorization Type BCBS    Authorization Time Period 02/03/22 to 03/17/22    PT Start Time 1101    PT Stop Time 1141    PT Time Calculation (min) 40 min    Activity Tolerance Patient tolerated treatment well    Behavior During Therapy [place] for tasks assessed/performed              Past Medical History:  Diagnosis Date   [place] illness 08/2018   [place]   BCC (basal cell carcinoma), face 09/2015   nose, s/p Mohs   Generalized anxiety disorder    Hypothyroidism    followed by Dr. Chalmers Cater   Multinodular goiter    Dr. Chalmers Cater   Osteopenia    per DEXA per GYN   Past Surgical History:  Procedure Laterality Date   MOHS SURGERY     BCC on nose--Dr. Sarajane Jews   SKIN SURGERY     R forehead, for cancer (prior to 2019)   Patient Active Problem List   Diagnosis Date Noted   Osteopenia of lumbar spine 11/04/2021   Vitamin D deficiency 11/04/2021   Multinodular goiter 11/04/2021   Anxiety state, unspecified 09/07/2011   LBP (low back pain) 09/07/2011    REFERRING DIAG: M54.50,G89.29 (ICD-10-CM) - Chronic left-sided low back pain without sciatica M25.552,G89.29 (ICD-10-CM) - Chronic left hip pain   THERAPY DIAG:  Other low back pain  Muscle weakness (generalized)  Other symptoms and signs involving the musculoskeletal system  Rationale for Evaluation and Treatment Rehabilitation  PERTINENT HISTORY: 61 y.o. female with  Chronic left low back pain with pain in the left lateral and anterior hip.  Pain is multifactorial.   The low back pain is thought to be due to muscle spasm and dysfunction.  She definitely has some  degenerative changes seen on x-ray which could be contributory as well.   The lateral hip pain is thought to be hip abductor tendinopathy or trochanteric bursitis.   Both of these issues could be better controlled with physical therapy.  Plan for trial of PT.   The anterior hip pain is more typical for intra-articular cause.  X-ray per my read does not show significant degenerative changes but labrum tear is certainly a possibility.  If not improving with physical therapy consider MRI arthrogram.   Recheck in 6 weeks.Marland Kitchen    PRECAUTIONS: none  SUBJECTIVE: I felt fine after last time. Last night horrible I woke up with a horrible pain in my hip I did a lot of pain in my left hip it feels like a knife is there. I did a lot of walking yesterday I wonder if this is causing the pain and if PT is causing the pain.   PAIN:  Are you having pain? Yes, 4/10  L hip  Aggravating factors: standing for a long time to cook  Relieving factors: tylenol, rest     OBJECTIVE: (objective measures completed at initial evaluation unless otherwise dated)   DIAGNOSTIC FINDINGS:  IMPRESSION: 1. Unremarkable radiographic appearance of the left hip. 2. Mild bilateral sacroiliac joint degenerative change.  IMPRESSION: 1. Mild multilevel degenerative disc disease. L5-S1 facet hypertrophy. 2. Mild broad-based dextroscoliotic curvature.     PATIENT SURVEYS:  FOTO 66   SCREENING FOR RED FLAGS: Bowel or bladder incontinence: No Spinal tumors: No Cauda equina syndrome: No Compression fracture: No Abdominal aneurysm: No   COGNITION:           Overall cognitive status: Within functional limits for tasks assessed                          SENSATION: Not tested   MUSCLE LENGTH: Quads mild limitation B, hip flexors mild limitation B, R HS and piriformis WNL, L HS and piriformis mild limitation    POSTURE: rounded shoulders, forward head, and decreased lumbar lordosis   PALPATION: No areas  specifically TTP    LUMBAR ROM:    Active  A/PROM  eval  Flexion WNL   Extension WNL   Right lateral flexion WNL tightness on L   Left lateral flexion WNL   Right rotation    Left rotation     (Blank rows = not tested)   LOWER EXTREMITY MMT:       Active  Right eval Left eval  Hip flexion 5 5  Hip extension 3+ 3+  Hip abduction 4+ 3+  Hip adduction      Hip internal rotation      Hip external rotation      Knee flexion 5 5  Knee extension 5 5  Ankle dorsiflexion 5 5  Ankle plantarflexion      Ankle inversion      Ankle eversion       (Blank rows = not tested)   LOWER EXTREMITY ROM   ROM Right  Left  Hip flexion      Hip extension      Hip abduction      Hip adduction      Hip internal rotation      Hip external rotation      Knee flexion      Knee extension      Ankle dorsiflexion      Ankle plantarflexion      Ankle inversion      Ankle eversion       (Blank rows = not tested)   LUMBAR SPECIAL TESTS:  Straight leg raise test: Negative, FABER test: Positive L, and hip scour test positive L , no LLD noted, immediate fail on double leg lower test indicating poor core strength    FUNCTIONAL TESTS:      GAIT:       TODAY'S TREATMENT   02/26/22  TherEx:  Nustep L6x6 minutes BLEs only  PPT x15 with 5 second holds  PPT with march x12  PPT with SLR 1x10 B PPT with double bent knee raise x10  PPT with opposite UE/LE flexion x10  3 way hip in standing x10 B red TB  Hip hikes x15 B Hip hikes + ABD x10 B    No change in pain patterns with REIS/RFIS, no significant LLD noted   Education that exercise/activity should be pain free, encouraged having instructors in fitness classes she takes double check her form     02/17/22:  TherEx:  Nustep: L5 x 6 minutes Hip flexor stretch x 2 each LE off side of mat table c strap for added quad stretch holding 30 sec Figure 4 hip stretch x 3 on Left LE and x 2 on Rt LE  holding 30 sec Piriformis stretch: x 2  each  LE holding 30 sec PPT x5 holding 5 sec Bridge with feet on green physioball x 10 holding 5 sec Quadraped: opposite arm/leg c tuck x 10 each side Standing on yoga block dropping hip for toe touch Muscle energy techniques using cane x 5 each side Manual:  STM to lumbar paraspinals left piriformis    02/03/22 Nustep L5 x6 minutes BLEs at least a 70spm pace    Bridges with ABD red TB 1x15 3 second holds  PPT 1x15 3 second holds  Sidelying hip ABD 1x10 red TB above knees  Quadruped hydrants red TB x10 B         PATIENT EDUCATION:  Education details: exam findings, POC, HEP  Person educated: Patient Education method: Explanation, Demonstration, and Handouts Education comprehension: verbalized understanding and returned demonstration     HOME EXERCISE PROGRAM: Access Code: [email or web address] URL: [email or web address] Date: 02/17/2022 Prepared by: Kearney Hard  Exercises - Supine Posterior Pelvic Tilt  - 2 x daily - 7 x weekly - 1 sets - 15 reps - 3 hold - Supine Bridge with Resistance Band  - 2 x daily - 7 x weekly - 1 sets - 15 reps - 3 hold - Hydrant with Resistance  - 2 x daily - 7 x weekly - 1 sets - 10 reps - 1 hold - Sidelying Hip Abduction with Resistance at Thighs  - 2 x daily - 7 x weekly - 1 sets - 10 reps - 2 hold - Supine Figure 4 Piriformis Stretch  - 2 x daily - 7 x weekly - 3 reps - 30 seconds hold - Supine Figure 4 Piriformis Stretch  - 2 x daily - 7 x weekly - 3 reps - 30 seconds hold - Supine Quadriceps Stretch with Strap on Table  - 2 x daily - 7 x weekly - 3 reps - 30 seconds hold - Pelvic Muscle Energy Technique With Flexion and Extension  - 7 x weekly - 3 sets - 10 reps    ASSESSMENT:   CLINICAL IMPRESSION:  Leoni arrives today doing OK,  started having a lot of pain in her L hip last night unsure what really caused the pain. We warmed up on the Nustep and then continued focus on core strength and hip strength today. I do wonder if she may  be overdoing it on her own in terms of activity and causing some extra inflammation/pain in this region too. Will continue efforts.      OBJECTIVE IMPAIRMENTS decreased mobility, difficulty walking, decreased strength, increased muscle spasms, impaired flexibility, improper body mechanics, postural dysfunction, and pain.    ACTIVITY LIMITATIONS carrying, lifting, standing, stairs, transfers, and locomotion level   PARTICIPATION LIMITATIONS: meal prep, cleaning, laundry, shopping, community activity, and yard work   PERSONAL FACTORS Age, Behavior pattern, Fitness, Past/current experiences, and Time since onset of injury/illness/exacerbation are also affecting patient's functional outcome.    REHAB POTENTIAL: Good   CLINICAL DECISION MAKING: Stable/uncomplicated   EVALUATION COMPLEXITY: Low     GOALS: Goals reviewed with patient? Yes   SHORT TERM GOALS: Target date: 02/24/2022   Will be compliant with appropriate progressive HEP  Baseline: Goal status: on-going 02/17/22   2.  Pain to be no more than 4/10 at worst  Baseline:  Goal status: on-going 02/17/22   3.  Will be able to tolerate standing tasks at least 60 minutes in duration without increase in pain  Baseline:  Goal status: INITIAL  4.  Will demonstrate improved understanding of posture and ergonomics  Baseline:  Goal status: INITIAL       LONG TERM GOALS: Target date: 03/17/2022   MMT to improve by one grade in all weak groups  Baseline:  Goal status: INITIAL   2.  Will be able to perform standing tasks for at least 2 hours with pain no more than 2/10 Baseline:  Goal status: INITIAL   3.  Will demonstrate correct functional mechanics for floor to waist height lifting to prevent exacerbating pain  Baseline:  Goal status: INITIAL   4.  Will demonstrate improved functional core strength by passing double leg lower test  Baseline:  Goal status: INITIAL     PLAN: PT FREQUENCY: 1x/week   PT DURATION: 6  weeks   PLANNED INTERVENTIONS: Therapeutic exercises, Therapeutic activity, Neuromuscular re-education, Balance training, Gait training, Patient/Family education, Self Care, Joint mobilization, Stair training, Dry Needling, Electrical stimulation, Spinal mobilization, Cryotherapy, Moist heat, Taping, Ultrasound, Ionotophoresis '4mg'$ /ml Dexamethasone, Manual therapy, and Re-evaluation.   PLAN FOR NEXT SESSION: Continue core and hip strength, postural strength, hip mobility exercises,  update HEP as needed. 1x/week due to high co-pay  Try quadraped hip strengthening     Winslow Verrill U PT DPT PN2  02/26/2022, 11:42 AM

## 2022-03-03 ENCOUNTER — Encounter: Payer: BC Managed Care – PPO | Admitting: Physical Therapy

## 2022-03-03 ENCOUNTER — Encounter: Payer: Self-pay | Admitting: Physical Therapy

## 2022-03-03 ENCOUNTER — Ambulatory Visit: Payer: BC Managed Care – PPO | Admitting: Physical Therapy

## 2022-03-03 DIAGNOSIS — M6281 Muscle weakness (generalized): Secondary | ICD-10-CM | POA: Diagnosis not present

## 2022-03-03 DIAGNOSIS — R29898 Other symptoms and signs involving the musculoskeletal system: Secondary | ICD-10-CM | POA: Diagnosis not present

## 2022-03-03 DIAGNOSIS — M5459 Other low back pain: Secondary | ICD-10-CM

## 2022-03-03 NOTE — Therapy (Addendum)
OUTPATIENT PHYSICAL THERAPY TREATMENT NOTE Discharge    Patient Name: Morgan Delacruz MRN: [id number] DOB:Oct 20, 1960, 61 y.o., female Today's Date: 03/03/2022  PCP: Rita Ohara, MD REFERRING PROVIDER: Gregor Hams, MD   END OF SESSION:   PT End of Session - 03/03/22 1353     Visit Number 4    Number of Visits 7    Date for PT Re-Evaluation 03/17/22    Authorization Type BCBS    Authorization Time Period 02/03/22 to 03/17/22    PT Start Time 1346    PT Stop Time 1431    PT Time Calculation (min) 45 min    Activity Tolerance Patient tolerated treatment well    Behavior During Therapy [facility] for tasks assessed/performed               Past Medical History:  Diagnosis Date   [place] illness 08/2018   [place]   BCC (basal cell carcinoma), face 09/2015   nose, s/p Mohs   Generalized anxiety disorder    Hypothyroidism    followed by Dr. Chalmers Cater   Multinodular goiter    Dr. Chalmers Cater   Osteopenia    per DEXA per GYN   Past Surgical History:  Procedure Laterality Date   MOHS SURGERY     BCC on nose--Dr. Sarajane Jews   SKIN SURGERY     R forehead, for cancer (prior to 2019)   Patient Active Problem List   Diagnosis Date Noted   Osteopenia of lumbar spine 11/04/2021   Vitamin D deficiency 11/04/2021   Multinodular goiter 11/04/2021   Anxiety state, unspecified 09/07/2011   LBP (low back pain) 09/07/2011    REFERRING DIAG: M54.50,G89.29 (ICD-10-CM) - Chronic left-sided low back pain without sciatica M25.552,G89.29 (ICD-10-CM) - Chronic left hip pain   THERAPY DIAG:  Other low back pain  Muscle weakness (generalized)  Other symptoms and signs involving the musculoskeletal system  Rationale for Evaluation and Treatment Rehabilitation  PERTINENT HISTORY: 62 y.o. female with  Chronic left low back pain with pain in the left lateral and anterior hip.  Pain is multifactorial.   The low back pain is thought to be due to muscle spasm and dysfunction.  She definitely  has some degenerative changes seen on x-ray which could be contributory as well.   The lateral hip pain is thought to be hip abductor tendinopathy or trochanteric bursitis.   Both of these issues could be better controlled with physical therapy.  Plan for trial of PT.   The anterior hip pain is more typical for intra-articular cause.  X-ray per my read does not show significant degenerative changes but labrum tear is certainly a possibility.  If not improving with physical therapy consider MRI arthrogram.   Recheck in 6 weeks.Marland Kitchen    PRECAUTIONS: none  SUBJECTIVE: Pt arriving today reporting 4/10 pain in left hip/groin.   PAIN:  Are you having pain? Yes, 4/10  L hip/groin Aggravating factors: standing for a long time to cook  Relieving factors: tylenol, rest     OBJECTIVE: (objective measures completed at initial evaluation unless otherwise dated)   DIAGNOSTIC FINDINGS:  IMPRESSION: 1. Unremarkable radiographic appearance of the left hip. 2. Mild bilateral sacroiliac joint degenerative change.   IMPRESSION: 1. Mild multilevel degenerative disc disease. L5-S1 facet hypertrophy. 2. Mild broad-based dextroscoliotic curvature.     PATIENT SURVEYS:  FOTO 66   SCREENING FOR RED FLAGS: Bowel or bladder incontinence: No Spinal tumors: No Cauda equina syndrome: No Compression fracture: No Abdominal aneurysm:  No   COGNITION:           Overall cognitive status: Within functional limits for tasks assessed                          SENSATION: Not tested   MUSCLE LENGTH: Quads mild limitation B, hip flexors mild limitation B, R HS and piriformis WNL, L HS and piriformis mild limitation    POSTURE: rounded shoulders, forward head, and decreased lumbar lordosis   PALPATION: No areas specifically TTP    LUMBAR ROM:    Active  A/PROM  eval  Flexion WNL   Extension WNL   Right lateral flexion WNL tightness on L   Left lateral flexion WNL   Right rotation    Left  rotation     (Blank rows = not tested)   LOWER EXTREMITY MMT:       Active  Right eval Left eval  Hip flexion 5 5  Hip extension 3+ 3+  Hip abduction 4+ 3+  Hip adduction      Hip internal rotation      Hip external rotation      Knee flexion 5 5  Knee extension 5 5  Ankle dorsiflexion 5 5  Ankle plantarflexion      Ankle inversion      Ankle eversion       (Blank rows = not tested)   LOWER EXTREMITY ROM   ROM Right  Left  Hip flexion      Hip extension      Hip abduction      Hip adduction      Hip internal rotation      Hip external rotation      Knee flexion      Knee extension      Ankle dorsiflexion      Ankle plantarflexion      Ankle inversion      Ankle eversion       (Blank rows = not tested)   LUMBAR SPECIAL TESTS:  Straight leg raise test: Negative, FABER test: Positive L, and hip scour test positive L , no LLD noted, immediate fail on double leg lower test indicating poor core strength    FUNCTIONAL TESTS:      GAIT:       TODAY'S TREATMENT   03/03/22 TherEx:  Nustep L6x6 minutes BLEs only  Supine heels on green physioball curling ball  2 x 10 Supine heels on green physioball lifting one leg at a time 2 x 5 Supine c legs resting over therapist knee at 90/90 degrees with hips floating/elevated off mat table to allow lumbar distraction Manual:  IASTM to Left glutes, piriformis,  and IT band using biofreeze/massage lotion Pt Edu:  We discussed TPDN for possible next visit, handout issued to pt     02/26/22 TherEx:  Nustep L6x6 minutes BLEs only  PPT x15 with 5 second holds  PPT with march x12  PPT with SLR 1x10 B PPT with double bent knee raise x10  PPT with opposite UE/LE flexion x10  3 way hip in standing x10 B red TB  Hip hikes x15 B Hip hikes + ABD x10 B    No change in pain patterns with REIS/RFIS, no significant LLD noted   Education that exercise/activity should be pain free, encouraged having instructors in fitness  classes she takes double check her form     02/17/22:  TherEx:  Nustep: L5  x 6 minutes Hip flexor stretch x 2 each LE off side of mat table c strap for added quad stretch holding 30 sec Figure 4 hip stretch x 3 on Left LE and x 2 on Rt LE holding 30 sec Piriformis stretch: x 2 each  LE holding 30 sec PPT x5 holding 5 sec Bridge with feet on green physioball x 10 holding 5 sec Quadraped: opposite arm/leg c tuck x 10 each side Standing on yoga block dropping hip for toe touch Muscle energy techniques using cane x 5 each side Manual:  STM to lumbar paraspinals left piriformis    02/03/22 Nustep L5 x6 minutes BLEs at least a 70spm pace    Bridges with ABD red TB 1x15 3 second holds  PPT 1x15 3 second holds  Sidelying hip ABD 1x10 red TB above knees  Quadruped hydrants red TB x10 B         PATIENT EDUCATION:  Education details: exam findings, POC, HEP  Person educated: Patient Education method: Explanation, Demonstration, and Handouts Education comprehension: verbalized understanding and returned demonstration     HOME EXERCISE PROGRAM: Access Code: [email or web address] URL: [email or web address] Date: 03/03/2022 Prepared by: Kearney Hard  Exercises - Supine Posterior Pelvic Tilt  - 2 x daily - 7 x weekly - 1 sets - 15 reps - 3 hold - Supine Bridge with Resistance Band  - 2 x daily - 7 x weekly - 1 sets - 15 reps - 3 hold - Hydrant with Resistance  - 2 x daily - 7 x weekly - 1 sets - 10 reps - 1 hold - Sidelying Hip Abduction with Resistance at Thighs  - 2 x daily - 7 x weekly - 1 sets - 10 reps - 2 hold - Supine Figure 4 Piriformis Stretch  - 2 x daily - 7 x weekly - 3 reps - 30 seconds hold - Supine Figure 4 Piriformis Stretch  - 2 x daily - 7 x weekly - 3 reps - 30 seconds hold - Supine Quadriceps Stretch with Strap on Table  - 2 x daily - 7 x weekly - 3 reps - 30 seconds hold - Pelvic Muscle Energy Technique With Flexion and Extension  - 7 x weekly - 3 sets - 10  reps - Supine March with Posterior Pelvic Tilt  - 1 x daily - 7 x weekly - 3 sets - 10 reps - Supine Double Bent Leg Lift  - 1 x daily - 7 x weekly - 3 sets - 10 reps - Dead Bug  - 1 x daily - 7 x weekly - 3 sets - 10 reps - Supine ITB Stretch with Strap  - 1 x daily - 7 x weekly - 3 reps - 30 seconds hold - Supine Hamstring Curl on Swiss Ball  - 1 x daily - 7 x weekly - 2 sets - 5 reps - Bridge with Heels on [employer]  - 1 x daily - 7 x weekly - 2 sets - 6 reps    ASSESSMENT:   CLINICAL IMPRESSION:  Pt arriving reporting 4/10 left hip pain which extends into left groin. We discussed taking a break for cardio and hip workout routines and just focus on exercises issued by therapist to allow time for recovery. IASTM with tightness noted in left gluteals, piriformis and IT band. Pt's HEP was updated and we discussed possible TPDN at next visit. Pt was issued handout and education provided. Continue skilled PT progressing toward LTG's set.  OBJECTIVE IMPAIRMENTS decreased mobility, difficulty walking, decreased strength, increased muscle spasms, impaired flexibility, improper body mechanics, postural dysfunction, and pain.    ACTIVITY LIMITATIONS carrying, lifting, standing, stairs, transfers, and locomotion level   PARTICIPATION LIMITATIONS: meal prep, cleaning, laundry, shopping, community activity, and yard work   PERSONAL FACTORS Age, Behavior pattern, Fitness, Past/current experiences, and Time since onset of injury/illness/exacerbation are also affecting patient's functional outcome.    REHAB POTENTIAL: Good   CLINICAL DECISION MAKING: Stable/uncomplicated   EVALUATION COMPLEXITY: Low     GOALS: Goals reviewed with patient? Yes   SHORT TERM GOALS: Target date: 02/24/2022   Will be compliant with appropriate progressive HEP  Baseline: Goal status: MET 03/03/22   2.  Pain to be no more than 4/10 at worst  Baseline:  Goal status: on-going 02/17/22   3.  Will be able to  tolerate standing tasks at least 60 minutes in duration without increase in pain  Baseline:  Goal status: On-going 03/03/22   4.  Will demonstrate improved understanding of posture and ergonomics  Baseline:  Goal status: Met 03/03/22       LONG TERM GOALS: Target date: 03/17/2022   MMT to improve by one grade in all weak groups  Baseline:  Goal status: INITIAL   2.  Will be able to perform standing tasks for at least 2 hours with pain no more than 2/10 Baseline:  Goal status: INITIAL   3.  Will demonstrate correct functional mechanics for floor to waist height lifting to prevent exacerbating pain  Baseline:  Goal status: INITIAL   4.  Will demonstrate improved functional core strength by passing double leg lower test  Baseline:  Goal status: INITIAL     PLAN: PT FREQUENCY: 1x/week   PT DURATION: 6 weeks   PLANNED INTERVENTIONS: Therapeutic exercises, Therapeutic activity, Neuromuscular re-education, Balance training, Gait training, Patient/Family education, Self Care, Joint mobilization, Stair training, Dry Needling, Electrical stimulation, Spinal mobilization, Cryotherapy, Moist heat, Taping, Ultrasound, Ionotophoresis 37m/ml Dexamethasone, Manual therapy, and Re-evaluation.   PLAN FOR NEXT SESSION: Continue core and hip strength, postural strength, hip mobility exercises,  1x/week due to high co-pay,  Consider TPDN,  Try quadraped hip strengthening     JKearney Hard PT, MPT 03/03/22 3:36 PM   03/03/2022, 3:36 PM  PHYSICAL THERAPY DISCHARGE SUMMARY  Visits from Start of Care: 4  Current functional level related to goals / functional outcomes: See above   Remaining deficits: See above   Education / Equipment: HEP   Patient agrees to discharge. Patient goals were Partially met. Patient is being discharged due to not returning since the last visit.   JKearney Hard PT, MPT 04/07/22 3:40 PM

## 2022-03-04 NOTE — Progress Notes (Unsigned)
   I, [name], LAT, ATC acting as a scribe for [name], MD.  MARIONETTE MESKILL is a 61 y.o. female who presents to [place] at [facility] today for f/u L hip and low back pain. Pt was last seen by Dr. Georgina Snell on 01/22/22 and was referred to PT, completing 4 visits. Today, pt reports  Dx imaging: 01/22/22 L hip & L-spine XR  Pertinent review of systems: ***  Relevant historical information: ***   Exam:  LMP 08/24/2011  General: Well Developed, well nourished, and in no acute distress.   MSK: ***    Lab and Radiology Results No results found for this or any previous visit (from the past 72 hour(s)). No results found.     Assessment and Plan: 61 y.o. female with ***   PDMP not reviewed this encounter. No orders of the defined types were placed in this encounter.  No orders of the defined types were placed in this encounter.    Discussed warning signs or symptoms. Please see discharge instructions. Patient expresses understanding.   ***

## 2022-03-05 ENCOUNTER — Ambulatory Visit: Payer: BC Managed Care – PPO | Admitting: Family Medicine

## 2022-03-05 VITALS — BP 104/72 | HR 74 | Ht 65.0 in | Wt 130.0 lb

## 2022-03-05 DIAGNOSIS — G8929 Other chronic pain: Secondary | ICD-10-CM

## 2022-03-05 DIAGNOSIS — M25552 Pain in left hip: Secondary | ICD-10-CM

## 2022-03-05 NOTE — Patient Instructions (Addendum)
Thank you for coming in today.   You should hear from MRI scheduling within 1 week. If you do not hear please let me know.   Check back after MRI  

## 2022-03-10 ENCOUNTER — Encounter: Payer: BC Managed Care – PPO | Admitting: Physical Therapy

## 2022-03-17 ENCOUNTER — Ambulatory Visit (INDEPENDENT_AMBULATORY_CARE_PROVIDER_SITE_OTHER): Payer: BC Managed Care – PPO

## 2022-03-17 ENCOUNTER — Encounter: Payer: BC Managed Care – PPO | Admitting: Physical Therapy

## 2022-03-17 ENCOUNTER — Ambulatory Visit (INDEPENDENT_AMBULATORY_CARE_PROVIDER_SITE_OTHER): Payer: BC Managed Care – PPO | Admitting: Sports Medicine

## 2022-03-17 DIAGNOSIS — M25552 Pain in left hip: Secondary | ICD-10-CM

## 2022-03-17 DIAGNOSIS — G8929 Other chronic pain: Secondary | ICD-10-CM

## 2022-03-17 MED ORDER — GADOPICLENOL 0.5 MMOL/ML IV SOLN
1.0000 mL | Freq: Once | INTRAVENOUS | Status: AC | PRN
  Administered 2022-03-17: 1 mL via INTRAVENOUS

## 2022-03-17 NOTE — Assessment & Plan Note (Signed)
Injection for MRI arthrography as above. Further management per primary treating provider.

## 2022-03-17 NOTE — Progress Notes (Signed)
    Procedures performed today:    Procedure: Real-time Ultrasound Guided gadolinium contrast injection of left hip joint Device: Samsung HS60  Verbal informed consent obtained.  Time-out conducted.  Noted no overlying erythema, induration, or other signs of local infection.  Skin prepped in a sterile fashion.  Local anesthesia: Topical Ethyl chloride.  With sterile technique and under real time ultrasound guidance: Arthritic joint, 22-gauge spinal needle advanced to the femoral head/neck junction, contacted bone, injected 1 cc kenalog 40, 2 cc lidocaine, 2 cc bupivacaine, syringe switched and 0.1 cc gadolinium injected, syringe again switched and 10 cc sterile saline used to fully distend the joint. Joint visualized and capsule seen distending confirming intra-articular placement of contrast material and medication. Completed without difficulty  Advised to call if fevers/chills, erythema, induration, drainage, or persistent bleeding.  Images permanently stored in PACS Impression: Technically successful ultrasound guided gadolinium contrast injection for MR arthrography.  Please see separate MR arthrogram report.  Independent interpretation of notes and tests performed by another provider:   None.  Brief History, Exam, Impression, and Recommendations:    Chronic left hip pain Injection for MRI arthrography as above. Further management per primary treating provider.    ____________________________________________ Gwen Her. Dianah Field, M.D., ABFM., CAQSM., AME. Primary Care and Sports Medicine [facility]  Adjunct Professor of [place] [employer] [facility] [employer]  Risk manager

## 2022-03-18 ENCOUNTER — Encounter: Payer: BC Managed Care – PPO | Admitting: Physical Therapy

## 2022-03-18 DIAGNOSIS — S73192A Other sprain of left hip, initial encounter: Secondary | ICD-10-CM

## 2022-03-18 HISTORY — DX: Other sprain of left hip, initial encounter: S73.192A

## 2022-03-19 NOTE — Progress Notes (Signed)
I, [name], LAT, ATC acting as a scribe for [name], MD.  Morgan Delacruz is a 61 y.o. female who presents to [place] at [facility] today for follow-up chronic left hip pain and MRI arthrogram review.  Patient was last seen by Dr. Georgina Snell on 03/05/2022 and was advised to proceed to an MRI arthrogram to further characterize the cause of pain.  Today, patient reports she thinks the injection from the MRI arthrogram has helped her L hip pain and it's not really painful today.   Pain is located predominantly at the left low back area and some anterior hip pain as well.  Dx imaging: 03/17/2022 left hip MRI arthrogram 01/22/22 L hip & L-spine XR  Pertinent review of systems: No fevers or chills  Relevant historical information: Osteopenia   Exam:  BP 124/76   Pulse 77   Ht '5\' 5"'$  (1.651 m)   Wt 129 lb (58.5 kg)   LMP 08/24/2011   SpO2 99%   BMI 21.47 kg/m  General: Well Developed, well nourished, and in no acute distress.   MSK: Normal lumbar and hip motion.    Lab and Radiology Results No results found for this or any previous visit (from the past 72 hour(s)). MR HIP LEFT W CONTRAST  Result Date: 03/18/2022 CLINICAL DATA:  Left hip pain. EXAM: MRI OF THE LEFT HIP WITH CONTRAST (MR Arthrogram) TECHNIQUE: Multiplanar, multisequence MR imaging of the hip was performed immediately following contrast injection into the hip joint under fluoroscopic guidance. No intravenous contrast was administered. COMPARISON:  None Available. FINDINGS: Bone No hip fracture, dislocation or avascular necrosis. No aggressive osseous lesion. SI joints are normal. No SI joint widening or erosive changes. 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 Alignment Normal. No subluxation. Dysplasia None. Joint effusion Intraarticular contrast distends the left hip joint capsule. Otherwise, no joint effusions. Labrum Left anterior labral tear.  Cartilage No focal chondral defect. Capsule and ligaments Normal. Muscles and Tendons Flexors: Normal. Extensors: Normal. Abductors: Normal. Adductors: Normal. Rotators: Normal. Hamstrings: Normal. Other Findings No bursal fluid. Viscera No abnormality seen in pelvis. No lymphadenopathy. No free fluid in the pelvis. IMPRESSION: 1. Left anterior labral tear. 2. No hip fracture, dislocation or avascular necrosis. 3. 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 Electronically Signed   By: Kathreen Devoid M.D.   On: 03/18/2022 22:36   [place] LIMITED JOINT SPACE STRUCTURES LOW LEFT  Result Date: 03/17/2022 Procedure: Real-time Ultrasound Guided gadolinium contrast injection of left hip joint Device: Samsung HS60 Verbal informed consent obtained. Time-out conducted. Noted no overlying erythema, induration, or other signs of local infection. Skin prepped in a sterile fashion. Local anesthesia: Topical Ethyl chloride. With sterile technique and under real time ultrasound guidance: Arthritic joint, 22-gauge spinal needle advanced to the femoral head/neck junction, contacted bone, injected 1 cc kenalog 40, 2 cc lidocaine, 2 cc bupivacaine, syringe switched and 0.1 cc gadolinium injected, syringe again switched and 10 cc sterile saline used to fully distend the joint. Joint visualized and capsule seen distending confirming intra-articular placement of contrast material and medication. Completed without difficulty Advised to call if fevers/chills, erythema, induration, drainage, or persistent bleeding. Images permanently stored in PACS Impression: Technically successful ultrasound guided gadolinium contrast injection for MR arthrography.  Please see separate MR arthrogram report.    I, [name], personally (independently) visualized and performed the interpretation of the left hip MRI images attached  in this note.    Assessment and Plan: 61 y.o. female with left anterior hip pain  due to labrum tear.  This pain has improved quite a bit after the arthrogram injection which did contain some steroid component.  She does not have a lot of arthritis and if needed could have a consultation for hip scope but given her age hip replacement if ultimately needed may be the better option.  She does not hurt enough to consider surgery at this time thankfully.  Her main source of pain is low back pain this pain is more likely to be related to the degenerative changes seen on lumbar spine on the hip MRI and lumbar spine x-ray.  She does have facet DJD L4-5 and L5-S1.  Plan to continue physical therapy.  If needed could target those left L4-5 and L5-S1 facet joints with facet injection in the future.  She will let me know.  Total encounter time 20 minutes including face-to-face time with the patient and, reviewing past medical record, and charting on the date of service.     PDMP not reviewed this encounter. Orders Placed This Encounter  Procedures   Ambulatory referral to Physical Therapy    Referral Priority:   Routine    Referral Type:   Physical Medicine    Referral Reason:   Specialty Services Required    Requested Specialty:   Physical Therapy    Number of Visits Requested:   1   No orders of the defined types were placed in this encounter.    Discussed warning signs or symptoms. Please see discharge instructions. Patient expresses understanding.   The above documentation has been reviewed and is accurate and complete [name], M.D.

## 2022-03-19 NOTE — Progress Notes (Signed)
MRI of the hip shows a labrum tear which could be part of the source of pain.  You do not have much arthritis at all.  Recommend return to clinic to talk about the results and treatment plan and options.

## 2022-03-20 ENCOUNTER — Ambulatory Visit: Payer: BC Managed Care – PPO | Admitting: Family Medicine

## 2022-03-20 VITALS — BP 124/76 | HR 77 | Ht 65.0 in | Wt 129.0 lb

## 2022-03-20 DIAGNOSIS — M25552 Pain in left hip: Secondary | ICD-10-CM | POA: Diagnosis not present

## 2022-03-20 DIAGNOSIS — G8929 Other chronic pain: Secondary | ICD-10-CM

## 2022-03-20 DIAGNOSIS — M545 Low back pain, unspecified: Secondary | ICD-10-CM

## 2022-03-20 NOTE — Patient Instructions (Addendum)
Thank you for coming in today.   Continue PT.   We can do back injections if needed.   Let me know.

## 2022-03-24 ENCOUNTER — Encounter: Payer: BC Managed Care – PPO | Admitting: Physical Therapy

## 2022-03-31 LAB — HM MAMMOGRAPHY

## 2022-03-31 LAB — RESULTS CONSOLE HPV: CHL HPV: NEGATIVE

## 2022-03-31 LAB — HM PAP SMEAR: HM Pap smear: NEGATIVE

## 2022-03-31 LAB — HM DEXA SCAN

## 2022-06-16 ENCOUNTER — Encounter: Payer: Self-pay | Admitting: Internal Medicine

## 2022-06-25 ENCOUNTER — Telehealth: Payer: Self-pay | Admitting: Family Medicine

## 2022-06-25 ENCOUNTER — Encounter: Payer: Self-pay | Admitting: Family Medicine

## 2022-06-25 NOTE — Telephone Encounter (Signed)
Patient donated blood about an hour prior, no issues or complications. Husband brought her to the office concerned because she was shaking, felt very weak.   While out in the car she got very nauseated, and was shivering (also is cold outside, car door was open). Evaluation in the car provided-- BP 100/60 P 70's and regular Glu 128  She is alert and oriented. Had eaten a protein bar.  Denied arm pain/issues (related to blood draw).  She declined coming into the office for a visit after being reassured with normal vital signs.

## 2022-07-14 NOTE — Progress Notes (Unsigned)
No chief complaint on file.  Patient presents as she still isn't feeling well after what occurred when she last donated blood. She donated blood 1/18, and had syncopal episode at the end (Not initially told to [place], when we tended to her in the parking lot--she had been chilled and nauseated at the time). She had chills, nausea.

## 2022-07-15 ENCOUNTER — Encounter: Payer: Self-pay | Admitting: *Deleted

## 2022-07-15 ENCOUNTER — Encounter: Payer: Self-pay | Admitting: Family Medicine

## 2022-07-15 ENCOUNTER — Ambulatory Visit: Payer: BC Managed Care – PPO | Admitting: Family Medicine

## 2022-07-15 VITALS — BP 120/78 | HR 64 | Ht 64.5 in | Wt 131.6 lb

## 2022-07-15 DIAGNOSIS — R5383 Other fatigue: Secondary | ICD-10-CM

## 2022-07-15 DIAGNOSIS — E039 Hypothyroidism, unspecified: Secondary | ICD-10-CM

## 2022-07-15 DIAGNOSIS — J309 Allergic rhinitis, unspecified: Secondary | ICD-10-CM

## 2022-07-15 DIAGNOSIS — R000 Tachycardia, unspecified: Secondary | ICD-10-CM

## 2022-07-15 DIAGNOSIS — E041 Nontoxic single thyroid nodule: Secondary | ICD-10-CM

## 2022-07-15 DIAGNOSIS — Z52000 Unspecified donor, whole blood: Secondary | ICD-10-CM

## 2022-07-15 NOTE — Patient Instructions (Signed)
Try and remember to use the flonase every day.  Use 2 sprays into each nostril, with gentle sniffs.  You may want to overlap with some antihistamine (claritin, allegra or zyrtec) until the flonase is completely effectively, or continue it daily, if needed.

## 2022-07-16 LAB — CBC WITH DIFFERENTIAL/PLATELET
Basophils Absolute: 0 10*3/uL (ref 0.0–0.2)
Basos: 1 %
EOS (ABSOLUTE): 0.3 10*3/uL (ref 0.0–0.4)
Eos: 8 %
Hematocrit: 36.2 % (ref 34.0–46.6)
Hemoglobin: 11.9 g/dL (ref 11.1–15.9)
Immature Grans (Abs): 0 10*3/uL (ref 0.0–0.1)
Immature Granulocytes: 0 %
Lymphocytes Absolute: 1.1 10*3/uL (ref 0.7–3.1)
Lymphs: 29 %
MCH: 31.7 pg (ref 26.6–33.0)
MCHC: 32.9 g/dL (ref 31.5–35.7)
MCV: 97 fL (ref 79–97)
Monocytes Absolute: 0.3 10*3/uL (ref 0.1–0.9)
Monocytes: 7 %
Neutrophils Absolute: 2.1 10*3/uL (ref 1.4–7.0)
Neutrophils: 55 %
Platelets: 328 10*3/uL (ref 150–450)
RBC: 3.75 x10E6/uL — ABNORMAL LOW (ref 3.77–5.28)
RDW: 11.4 % — ABNORMAL LOW (ref 11.7–15.4)
WBC: 3.8 10*3/uL (ref 3.4–10.8)

## 2022-07-16 LAB — FERRITIN: Ferritin: 16 ng/mL (ref 15–150)

## 2022-07-16 LAB — TSH: TSH: 1.51 u[IU]/mL (ref 0.450–4.500)

## 2022-08-14 ENCOUNTER — Ambulatory Visit (HOSPITAL_COMMUNITY)
Admission: EM | Admit: 2022-08-14 | Discharge: 2022-08-14 | Disposition: A | Discharge status: Home / Self Care | Payer: BC Managed Care – PPO

## 2022-08-14 ENCOUNTER — Encounter (HOSPITAL_COMMUNITY): Payer: Self-pay

## 2022-08-14 ENCOUNTER — Ambulatory Visit (INDEPENDENT_AMBULATORY_CARE_PROVIDER_SITE_OTHER): Payer: BC Managed Care – PPO

## 2022-08-14 DIAGNOSIS — M545 Low back pain, unspecified: Secondary | ICD-10-CM | POA: Diagnosis not present

## 2022-08-14 DIAGNOSIS — M5416 Radiculopathy, lumbar region: Secondary | ICD-10-CM | POA: Diagnosis not present

## 2022-08-14 MED ORDER — METHYLPREDNISOLONE SODIUM SUCC 125 MG IJ SOLR
INTRAMUSCULAR | Status: AC
  Filled 2022-08-14: qty 2

## 2022-08-14 MED ORDER — PREDNISONE 20 MG PO TABS: 40.0000 mg | ORAL_TABLET | Freq: Every day | ORAL | 0 refills | Status: AC

## 2022-08-14 MED ORDER — NAPROXEN 500 MG PO TABS: 500.0000 mg | ORAL_TABLET | Freq: Two times a day (BID) | ORAL | 0 refills | Status: AC

## 2022-08-14 MED ORDER — METHYLPREDNISOLONE SODIUM SUCC 125 MG IJ SOLR
60.0000 mg | Freq: Once | INTRAMUSCULAR | Status: AC
  Administered 2022-08-14: 60 mg via INTRAMUSCULAR

## 2022-08-14 NOTE — ED Provider Notes (Signed)
[place] [facility] [place]    CSN: [id number] Arrival date & time: 08/14/22  [id number]      History   Chief Complaint Chief Complaint  Patient presents with   Back Pain    HPI Morgan Delacruz is a 62 y.o. female.    Reports lower back pain since Tuesday. Lumbar pain that shoots down her left side.  Unable to sit or lay down comfortably. Walking does not increase pain. Feels better with staning.  Started yesterday morning, took 2 extra strength tylenol inAM when she woke up in pain, ice and heat, took 2 aleeve, before bed took meloxicam to see if this would help, no releif.   On Tuesday she was participating in an exercise class, laying down and lifting weights over her head and lifting her legs at the same time.  Torn labrium on the left side, MRI w/ contrast shows some DDD and arthritis  Tuesday evening was lifting one of her lawnmowers into a truck as well  Reports hx of lower back pain   The history is provided by the patient and medical records.  Back Pain Associated symptoms: no abdominal pain, no chest pain, no dysuria and no fever     Past Medical History:  Diagnosis Date   [place] illness 08/2018   [place]   BCC (basal cell carcinoma), face 09/2015   nose, s/p Mohs   Generalized anxiety disorder    Hypothyroidism    followed by Dr. Chalmers Cater   Labral tear of left hip joint 03/18/2022   Dr.Corey   Multinodular goiter    Dr. Chalmers Cater   Osteopenia    per DEXA per GYN    Patient Active Problem List   Diagnosis Date Noted   Chronic left hip pain 03/17/2022   Osteopenia of lumbar spine 11/04/2021   Vitamin D deficiency 11/04/2021   Multinodular goiter 11/04/2021   Anxiety state, unspecified 09/07/2011   LBP (low back pain) 09/07/2011    Past Surgical History:  Procedure Laterality Date   MOHS SURGERY     BCC on nose--Dr. Sarajane Jews   SKIN SURGERY     R forehead, for cancer (prior to 2019)    OB History   No obstetric history on file.      Home  Medications    Prior to Admission medications   Medication Sig Start Date End Date Taking? Authorizing Provider  acetaminophen (TYLENOL) 325 MG tablet Take 650 mg by mouth every 6 (six) hours as needed.   Yes [provider]  calcium-vitamin D (OSCAL WITH D) 500-200 MG-UNIT tablet Take 1 tablet by mouth daily.   Yes [provider]  fluticasone (FLONASE) 50 MCG/ACT nasal spray PLACE 2 SPRAYS INTO BOTH NOSTRILS DAILY. 08/27/21  Yes Rita Ohara, MD  levothyroxine (SYNTHROID, LEVOTHROID) 88 MCG tablet Take 88 mcg by mouth daily before breakfast.  11/18/16  Yes [provider]  meloxicam (MOBIC) 15 MG tablet Take 15 mg by mouth as needed for pain.   Yes [provider]  Multiple Vitamins-Minerals (MULTIVITAMIN ADULTS 50+ PO) Take 1 tablet by mouth daily.   Yes [provider]  naproxen (NAPROSYN) 500 MG tablet Take 1 tablet (500 mg total) by mouth 2 (two) times daily for 5 days. 08/14/22 08/19/22 Yes Louretta Shorten, [place], FNP  phenylephrine (SUDAFED PE) 10 MG TABS tablet Take 10 mg by mouth every 4 (four) hours as needed.   Yes [provider]  predniSONE (DELTASONE) 20 MG tablet Take 2 tablets (40 mg  total) by mouth daily for 5 days. 08/14/22 08/19/22 Yes Suzetta Timko, [place], FNP    Family History Family History  Problem Relation Age of Onset   Hypothyroidism Mother    Pulmonary embolism Mother    Diverticulitis Mother        developed fistula to bladder; has colostomy   Diabetes Mother        h/o prediabetes, resolved   Kidney disease Mother    Vision loss Mother        related to plaquenil use   Arthritis Mother        RA and OA   Hyperlipidemia Father    Ulcers Father 84       bleeding ulcer   Arthritis Sister    Anxiety disorder Sister    Cancer Maternal Grandmother        breast cancer in 60's   Cancer Maternal Grandfather        leukemia, diagnosed in 28's   Diabetes Maternal Grandfather    Heart disease Paternal Grandfather         87's   ADD / ADHD Son     Social History Social History   Tobacco Use   Smoking status: Never   Smokeless tobacco: Never  Vaping Use   Vaping Use: Never used  Substance Use Topics   Alcohol use: Yes    Comment: 0-1 per week.   Drug use: Never     Allergies   Codeine   Review of Systems Review of Systems  Constitutional:  Negative for chills and fever.  HENT:  Negative for ear pain and sore throat.   Eyes:  Negative for pain and visual disturbance.  Respiratory:  Negative for cough and shortness of breath.   Cardiovascular:  Negative for chest pain and palpitations.  Gastrointestinal:  Negative for abdominal pain and vomiting.  Genitourinary:  Negative for dysuria and hematuria.  Musculoskeletal:  Positive for back pain. Negative for arthralgias, gait problem, joint swelling and neck pain.  Skin:  Negative for color change and rash.  Neurological:  Negative for seizures and syncope.  All other systems reviewed and are negative.    Physical Exam Triage Vital Signs ED Triage Vitals  Enc Vitals Group     BP 08/14/22 1022 130/79     Pulse Rate 08/14/22 1022 64     Resp 08/14/22 1022 16     Temp 08/14/22 1022 (!) 97.4 F (36.3 C)     Temp Source 08/14/22 1022 Oral     SpO2 08/14/22 1022 99 %     Weight 08/14/22 1021 129 lb (58.5 kg)     Height 08/14/22 1021 '5\' 5"'$  (1.651 m)     Head Circumference --      Peak Flow --      Pain Score 08/14/22 1020 9     Pain Loc --      Pain Edu? --      Excl. in [place]? --    No data found.  Updated Vital Signs BP 130/79 (BP Location: Left Arm)   Pulse 64   Temp (!) 97.4 F (36.3 C) (Oral)   Resp 16   Ht '5\' 5"'$  (1.651 m)   Wt 129 lb (58.5 kg)   LMP 08/24/2011   SpO2 99%   BMI 21.47 kg/m   Visual Acuity Right Eye Distance:   Left Eye Distance:   Bilateral Distance:    Right Eye Near:   Left Eye Near:    Bilateral  Near:     Physical Exam Vitals and nursing note reviewed.  Constitutional:      General: She is  not in acute distress.    Appearance: She is well-developed.  HENT:     Head: Normocephalic and atraumatic.     Mouth/Throat:     Mouth: Mucous membranes are moist.  Eyes:     Conjunctiva/sclera: Conjunctivae normal.     Pupils: Pupils are equal, round, and reactive to light.  Cardiovascular:     Rate and Rhythm: Normal rate and regular rhythm.     Heart sounds: Normal heart sounds. No murmur heard. Pulmonary:     Effort: Pulmonary effort is normal. No respiratory distress.     Breath sounds: Normal breath sounds.  Musculoskeletal:        General: Signs of injury present. No swelling, tenderness or deformity. Normal range of motion.     Cervical back: Normal, normal range of motion and neck supple. No rigidity or tenderness.     Thoracic back: Normal.     Lumbar back: No swelling, deformity, lacerations, tenderness or bony tenderness. Normal range of motion. Negative right straight leg raise test and negative left straight leg raise test.       Back:     Right lower leg: No edema.     Left lower leg: No edema.     Comments: Reports left-sided lumbar pain that radiates down into her left leg.  Denies incontinence, saddle anesthesia.  Skin:    General: Skin is warm and dry.     Capillary Refill: Capillary refill takes less than 2 seconds.  Neurological:     Mental Status: She is alert.  Psychiatric:        Mood and Affect: Mood normal.        Behavior: Behavior is cooperative.      UC Treatments / Results  Labs (all labs ordered are listed, but only abnormal results are displayed) Labs Reviewed - No data to display  EKG   Radiology DG Lumbar Spine Complete  Result Date: 08/14/2022 CLINICAL DATA:  Low back pain radiating into the legs. Recent lifting injury. EXAM: LUMBAR SPINE - COMPLETE 4+ VIEW COMPARISON:  Lumbar spine radiographs 01/22/2022. FINDINGS: There are 5 lumbar type vertebral bodies (more obvious on previous study when patient was not wearing a bra). The  alignment is stable with a mild convex right scoliosis centered at L2-3. The lateral alignment is normal. No evidence of acute fracture or pars defect. Stable mild multilevel spondylosis with disc space narrowing, endplate osteophytes and facet hypertrophy. IMPRESSION: Stable lumbar spondylosis and alignment. No acute osseous findings. Electronically Signed   By: Richardean Sale M.D.   On: 08/14/2022 11:06    Procedures Procedures (including critical care time)  Medications Ordered in UC Medications  methylPREDNISolone sodium succinate (SOLU-MEDROL) 125 mg/2 mL injection 60 mg (has no administration in time range)    Initial Impression / Assessment and Plan / UC Course  I have reviewed the triage vital signs and the nursing notes.  Pertinent labs & imaging results that were available during my care of the patient were reviewed by me and considered in my medical decision making (see chart for details).  Vitals and triage reviewed, patient is hemodynamically stable. Pain since Thursday, heavy lifting and exercise changes on Tuesday. Hx of DDD, osteopenia and Vit D def, will obtain lumbar imaging to r/o fx or dislocation. Imaging shows stable lumbar spondylosis and alignment. No acute osseous findings.  Physical  exam without red flag findings, without saddle anesthesia, or incontinence.  Suspect lumbar radiculopathy, will give IM steroids in clinic d/t obvious discomfort and pacing. Treatment plan precautions, and follow-up, return discussed, patient verbalized understanding.  No questions at this time.     Final Clinical Impressions(s) / UC Diagnoses   Final diagnoses:  Lumbar radiculopathy     Discharge Instructions      Your x-ray was negative for fractures or dislocation. Your back pain is most likely from impairment of a lumbosacral nerve root, leading to root compression. To help with this, we have given you a steroid shot in clinic and are sending you home on anti-inflammatories and  a short duration of steroids. Please take naproxen with food and discontinue immediately if you develop abdominal pain, blood in your stool, or any bleeding. Please reduce physical activity for the next week to allow time for your back to heal. If your symptoms persist, please call and schedule an appointment with [place].   Please return to clinic or seek immediate care if you develop numbness, tingling, weakness or worsening of symptoms.      ED Prescriptions     Medication Sig Dispense Auth. Provider   naproxen (NAPROSYN) 500 MG tablet Take 1 tablet (500 mg total) by mouth 2 (two) times daily for 5 days. 10 tablet Louretta Shorten, [place], [place]   predniSONE (DELTASONE) 20 MG tablet Take 2 tablets (40 mg total) by mouth daily for 5 days. 10 tablet Clint Biello, [place], [place]      I have reviewed the PDMP during this encounter.   Chalee Hirota, [place], [place] 08/14/22 1119

## 2022-08-14 NOTE — Discharge Instructions (Addendum)
Your x-ray was negative for fractures or dislocation. Your back pain is most likely from impairment of a lumbosacral nerve root, leading to root compression. To help with this, we have given you a steroid shot in clinic and are sending you home on anti-inflammatories and a short duration of steroids. Please take naproxen with food and discontinue immediately if you develop abdominal pain, blood in your stool, or any bleeding. Please reduce physical activity for the next week to allow time for your back to heal. If your symptoms persist, please call and schedule an appointment with [place].   Please return to clinic or seek immediate care if you develop numbness, tingling, weakness or worsening of symptoms.

## 2022-08-14 NOTE — ED Triage Notes (Signed)
Chief Complaint: back pain in the lower mid back radiation into the legs. Has history of DDD and arthritis. Patient was doing a workout Tuesday morning and loading lawn mowers onto a trailer.   Onset: Yesterday morning with waking.   Prescriptions or OTC medications tried: Yes- tylenol extra strength, Aleve, Meloxicam     with no relief

## 2022-10-16 ENCOUNTER — Other Ambulatory Visit: Payer: Self-pay | Admitting: Family Medicine

## 2022-10-16 DIAGNOSIS — J309 Allergic rhinitis, unspecified: Secondary | ICD-10-CM

## 2022-11-11 ENCOUNTER — Encounter: Payer: Self-pay | Admitting: *Deleted

## 2022-11-11 NOTE — Patient Instructions (Incomplete)
  HEALTH MAINTENANCE RECOMMENDATIONS:  It is recommended that you get at least 30 minutes of aerobic exercise at least 5 days/week (for weight loss, you may need as much as 60-90 minutes). This can be any activity that gets your heart rate up. This can be divided in 10-15 minute intervals if needed, but try and build up your endurance at least once a week.  Weight bearing exercise is also recommended twice weekly.  Eat a healthy diet with lots of vegetables, fruits and fiber.  "Colorful" foods have a lot of vitamins (ie green vegetables, tomatoes, red peppers, etc).  Limit sweet tea, regular sodas and alcoholic beverages, all of which has a lot of calories and sugar.  Up to 1 alcoholic drink daily may be beneficial for women (unless trying to lose weight, watch sugars).  Drink a lot of water.  Calcium recommendations are 1200-1500 mg daily (1500 mg for postmenopausal women or women without ovaries), and vitamin D 1000 IU daily.  This should be obtained from diet and/or supplements (vitamins), and calcium should not be taken all at once, but in divided doses.  Monthly self breast exams and yearly mammograms for women over the age of 78 is recommended.  Sunscreen of at least SPF 30 should be used on all sun-exposed parts of the skin when outside between the hours of 10 am and 4 pm (not just when at beach or pool, but even with exercise, golf, tennis, and yard work!)  Use a sunscreen that says "broad spectrum" so it covers both UVA and UVB rays, and make sure to reapply every 1-2 hours.  Remember to change the batteries in your smoke detectors when changing your clock times in the spring and fall. Carbon monoxide detectors are recommended for your home.  Use your seat belt every time you are in a car, and please drive safely and not be distracted with cell phones and texting while driving.  Continue yearly flu shots in the Fall. You can consider getting the RSV vaccine in the Fall (get from the  pharmacy).  You can get this the same day as your flu shot, or separate by about 2 weeks.  COVID booster

## 2022-11-11 NOTE — Progress Notes (Addendum)
Chief Complaint  Patient presents with   Annual Exam    Fasting annual exam. Is going to schedule with eye doctor. Sees GYN, Belva Agee at [facility]. She cannot remember if they did DEXA so I can request. Did not have covid booster in the fall, should she get today or wait until the fall? No new concerns.   Morgan Delacruz is a 62 y.o. female who presents for a complete physical.  She had annual GYN exam in 03/2022 with Belva Agee M.D.   She has noticed some memory concerns.  She is very busy caring for her parents (medications, doctor appointments, yardwork), has a lot on her plate.  Unsure if memory is truly any different, but it concerns her more as she sees her mom with some memory issues.  Her husband may mention a conversation they had, or something about a trip they took that she cannot recall.  No daily problems (not losing things, forgetting to pay bills, getting lost, or any other significant issues).  Seasonal allergies:  She uses Flonase seasonally.  Recently filled. Allergies tend to flare in the Spring.  She had increased congestion after returning from [facility].  Denies fever, discolored mucus.  Flonase is effective, with sudafed as needed.  She went to UC in 08/2022 with back pain. She was diagnosed with lumbar radiculopathy (radiated down the left leg).  She was treated with solumedrol, and prescribed naproxen and 40mg  prednisone, each for 5 days.  She took the prednisone course, not the naproxen, but uses the naproxen just prn.  Not having any back pain today. Tends to flare some after yard work. X-ray 08/2022: IMPRESSION: Stable lumbar spondylosis and alignment. No acute osseous findings.  L hip and knee pain:  Currently only has sharp pains with certain positions/movements with her hip (hurts at the groin). No currently having any knee pain. She saw Dr. Denyse Amass in August through October. She first had PT, then had MRI.  She was found to have a labrum tear. Pain  had improved some after the arthrogram injection (contained some steroid component.)  She saw Dr. Sherlean Foot in 06/2022. Reviewed MRI results from October 2023. They referred her to Dr. Caswell Corwin to discuss potential surgical intervention. Knee pain was felt to be compensatory, due to her hip pain. She saw Dr. Caswell Corwin in [place] in 08/2022 for evaluation. X-ray 08/2022: 1.  No acute fracture. 2.  Mild degenerative changes of the bilateral SI joints, hips and pubic symphysis. 3.  Partially imaged degenerative changes in the lower lumbar spine.  She preferred non-operative management, and continues with HEP.  She doesn't recall that surgery was recommended, other than likely needing a hip replacement within the next 5 years.  Osteopenia:  DEXA's are performed at her GYN. 03/2020 T-2.3 at spine (decreased from -2.1 in 2019). She saw the GYN in the Fall, can't recall if it was repeated (she thinks so). No records received.  Vitamin D level was a little low at 26.7 in 07/2019, when she had been inconsistent with taking her vitamins.  Last level was normal at 41.6 in 11/2021, when taking MVI daily, and vitamin D 5000 IU 2x/week. She is currently taking 5000 IU about 4 days/week (she uses a pill box but still forgets some).  H/o elevated lipids in the past. She continues to eat a healthy diet. Occasional red meat. Has some feta on her salads, otherwise not much cheese, not many eggs. Denies any changes to her diet. Lipids  were fine last year: Lab Results  Component Value Date   CHOL 209 (H) 11/06/2021   HDL 84 11/06/2021   LDLCALC 116 (H) 11/06/2021   TRIG 52 11/06/2021   CHOLHDL 2.5 11/06/2021   Sees Dr. Talmage Nap for multinodular goiter.  Last ultrasound was in 01/2022. Nodules were stable. No further f/u recommended. She continues on thyroid medication, monitored/prescribed by Dr. Talmage Nap.  We checked it recently when she noted some tachycardia (pt thought she may have taken an extra dose, which could have  caused it, no effect on lab). Denies changes to hair/skin/bowels. No further palpitations.  Some constipation (relates to her calcium pill). Lab Results  Component Value Date   TSH 1.510 07/15/2022   IMPRESSION: 1. Right thyroid nodules are unchanged since 2016, which indicates a benign etiology. 2. No new thyroid nodules.   Immunization History  Administered Date(s) Administered   Hepatitis A, Adult 11/25/2016, 07/20/2019   Influenza,inj,quad, With Preservative 01/06/2021   Influenza-Unspecified 03/07/2020, 03/08/2022   Moderna Covid-19 Vaccine Bivalent Booster 70yrs & up 05/06/2021   Moderna Sars-Covid-2 Vaccination 08/18/2019, 09/18/2019, 05/11/2020   Tdap 11/25/2016   Zoster Recombinat (Shingrix) 04/03/2020, 06/05/2020   Last Pap smear: [facility], 03/2021, unsatisfactory.  Last saw GYN 03/2022, no records received.  Last mammogram: 03/2021 at GYN's office--pt states had 03/2022, will request records Last colonoscopy: 04/2020, Dr. Kinnie Scales.  Diverticulosis, internal hemorrhoids Last DEXA:  03/2020 at GYN, osteopenia T-2.3 at spine (decreased from -2.1 in 2019). She isn't sure if she had this repeated.  We will call for records (non received). Dentist: twice a year Ophtho: every other year Exercise: Exercises classes with weights 3-4x/week.  Exercises 45 minutes of exercise 4-5 days/week (tracks on her watch). Walks frequently. Does yardwork for parents, lifts heavy items, push-mows.  Sees dermatologist yearly.   PMH, PSH, SH and FH were reviewed and updated.  Outpatient Encounter Medications as of 11/12/2022  Medication Sig Note   calcium-vitamin D (OSCAL WITH D) 500-200 MG-UNIT tablet Take 1 tablet by mouth daily. 11/12/2022: Believes she is taking a calcium tablet only (no D); takes about 4 days/week   Cholecalciferol (VITAMIN D) 125 MCG (5000 UT) CAPS Take by mouth. 11/12/2022: Takes about 4x/week   fluticasone (FLONASE) 50 MCG/ACT nasal spray PLACE 2 SPRAYS INTO BOTH  NOSTRILS DAILY. 11/12/2022: 3-4 times a week   levothyroxine (SYNTHROID, LEVOTHROID) 88 MCG tablet Take 88 mcg by mouth daily before breakfast.     Multiple Vitamins-Minerals (MULTIVITAMIN ADULTS 50+ PO) Take 1 tablet by mouth daily.    phenylephrine (SUDAFED PE) 10 MG TABS tablet Take 10 mg by mouth every 4 (four) hours as needed. 11/12/2022: Took one yesterday   acetaminophen (TYLENOL) 325 MG tablet Take 650 mg by mouth every 6 (six) hours as needed. (Patient not taking: Reported on 11/12/2022) 11/12/2022: As needed   [DISCONTINUED] meloxicam (MOBIC) 15 MG tablet Take 15 mg by mouth as needed for pain.    No facility-administered encounter medications on file as of 11/12/2022.   Allergies  Allergen Reactions   Codeine Other (See Comments)    Heart racing/palpitations.    ROS:  The patient denies anorexia, fever, vision changes, decreased hearing, ear pain, sore throat, breast concerns, chest pain, palpitations, dizziness, syncope, dyspnea on exertion, cough, swelling, nausea, vomiting, diarrhea, constipation, abdominal pain, melena, hematochezia, indigestion/heartburn, hematuria, incontinence, dysuria, vaginal bleeding, discharge, odor or itch, genital lesions, joint pains, numbness, tingling, weakness, tremor, suspicious skin lesions, depression, abnormal bleeding, or enlarged lymph nodes. Some bruising.  Moods are good.  Memory concern per HPI. Denies any insomnia. Allergies are controlled with Flonase and sudafed prn. LBP resolved, occurs periodically after yardwork. Left hip pain per HPI.  No longer having L knee pain.   PHYSICAL EXAM:  BP 122/70   Pulse 60   Ht 5' 4.5" (1.638 m)   Wt 128 lb (58.1 kg)   LMP 08/24/2011   BMI 21.63 kg/m   Wt Readings from Last 3 Encounters:  11/12/22 128 lb (58.1 kg)  08/14/22 129 lb (58.5 kg)  07/15/22 131 lb 9.6 oz (59.7 kg)    General Appearance:    Alert, cooperative, no distress, appears stated age  Head:    Normocephalic, without obvious  abnormality, atraumatic  Eyes:    PERRL, conjunctiva/corneas clear, EOM's intact, fundi    benign  Ears:    Normal TM's and external ear canals  Nose:   Normal, no drainage  Throat:   Normal mucosa, no lesions  Neck:   Supple, no lymphadenopathy;  thyroid:  No enlargement/ tenderness.  Nodule palpable on R lobe of thyroid, nontender.  no carotid bruit or JVD  Back:    Spine nontender, no curvature, ROM normal, no CVA     tenderness  Lungs:     Clear to auscultation bilaterally without wheezes, rales or     ronchi; respirations unlabored  Chest Wall:    No tenderness or deformity   Heart:    Regular rate and rhythm, S1 and S2 normal, no murmur, rub   or gallop  Breast Exam:    Deferred to GYN  Abdomen:     Soft, non-tender, nondistended, normoactive bowel sounds,    no masses, no hepatosplenomegaly  Genitalia:    Deferred to GYN     Extremities:   No clubbing, cyanosis or edema.   Pulses:   2+ and symmetric all extremities  Skin:   Skin texture, turgor normal, no rashes or lesions (limited exam, didn't change into gown).  Healing ecchymosis R upper arm, and small abrasion on R forearm (related to yardwork).  Lymph nodes:   Cervical, supraclavicular nodes normal  Neurologic:   Alert and oriented. Normal strength, sensation and gait; reflexes 2+ and symmetric throughout                               Psych:   Normal mood, affect, hygiene and grooming.     ASSESSMENT/PLAN:  Annual physical exam - Plan: CBC with Differential/Platelet, CMP14+EGFR, Lipid panel, POCT Urinalysis DIP (Proadvantage Device)  Osteopenia of lumbar spine - discussed Ca, vitamin D, weight-bearing exercise. Will get GYN records and see if DEXA repeated. If not, recommended with next visit  Vitamin D deficiency - now taking 20,000/week, plus MVI. Recheck. Adjust recs based on level - Plan: VITAMIN D 25 Hydroxy (Vit-D Deficiency, Fractures)  Multinodular goiter - nodule on R has been stable, monitored with [place]. No  further monitoring needed. Under care of Dr. Talmage Nap  Allergic rhinitis, unspecified seasonality, unspecified trigger - cont Flonase (daily use recommended). 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  Not impacting her activities much, prefers to hold off on any surgery at this time. Remains active  Memory difficulties - discussed importance of sleep, staying focused/paying attention for important discussion, physical and brain exercises, Mediterranean diet  Need for COVID-19  vaccine - Plan: Pfizer Fall 2023 Covid-19 Vaccine 44yrs and older   Counseled re: memory and osteopenia. Will get GYN records from 03/2022 visit (?pap, mammo, ?DEXA)  Discussed monthly self breast exams and yearly mammograms; at least 30 minutes of aerobic activity at least 5 days/week, weight-bearing exercise at least 2x.wk; proper sunscreen use reviewed; healthy diet, including goals of calcium and vitamin D intake and alcohol recommendations (less than or equal to 1 drink/day) reviewed; regular seatbelt use; changing batteries in smoke detectors.  Immunization recommendations discussed--continue yearly flu shots. Consider RSV vaccine in the Fall.  COVID booster given.  Advised to wait 4 months if an updated one becomes available in the Fall.  Colonoscopy recommendations reviewed, UTD  F/u 1 year, sooner prn.  ADDENDUM: Lab Results  Component Value Date   WBC 3.0 (L) 11/12/2022   HGB 13.2 11/12/2022   HCT 40.2 11/12/2022   MCV 95 11/12/2022   PLT 260 11/12/2022   She had mentioned not feeling great after a recent trip, ?virus vs jet lag/altitude.  I recommend recheck CBC in a month. ANC was 1.1

## 2022-11-12 ENCOUNTER — Ambulatory Visit (INDEPENDENT_AMBULATORY_CARE_PROVIDER_SITE_OTHER): Payer: BC Managed Care – PPO | Admitting: Family Medicine

## 2022-11-12 ENCOUNTER — Encounter: Payer: Self-pay | Admitting: Family Medicine

## 2022-11-12 VITALS — BP 122/70 | HR 60 | Ht 64.5 in | Wt 128.0 lb

## 2022-11-12 DIAGNOSIS — Z0000 Encounter for general adult medical examination without abnormal findings: Secondary | ICD-10-CM

## 2022-11-12 DIAGNOSIS — M8588 Other specified disorders of bone density and structure, other site: Secondary | ICD-10-CM | POA: Diagnosis not present

## 2022-11-12 DIAGNOSIS — E559 Vitamin D deficiency, unspecified: Secondary | ICD-10-CM

## 2022-11-12 DIAGNOSIS — D709 Neutropenia, unspecified: Secondary | ICD-10-CM

## 2022-11-12 DIAGNOSIS — E042 Nontoxic multinodular goiter: Secondary | ICD-10-CM

## 2022-11-12 DIAGNOSIS — R413 Other amnesia: Secondary | ICD-10-CM

## 2022-11-12 DIAGNOSIS — Z23 Encounter for immunization: Secondary | ICD-10-CM | POA: Diagnosis not present

## 2022-11-12 DIAGNOSIS — G8929 Other chronic pain: Secondary | ICD-10-CM

## 2022-11-12 DIAGNOSIS — M25552 Pain in left hip: Secondary | ICD-10-CM

## 2022-11-12 DIAGNOSIS — J309 Allergic rhinitis, unspecified: Secondary | ICD-10-CM

## 2022-11-12 LAB — POCT URINALYSIS DIP (PROADVANTAGE DEVICE)
Bilirubin, UA: NEGATIVE
Blood, UA: NEGATIVE
Glucose, UA: NEGATIVE mg/dL
Ketones, POC UA: NEGATIVE mg/dL
Leukocytes, UA: NEGATIVE
Nitrite, UA: NEGATIVE
Protein Ur, POC: NEGATIVE mg/dL
Specific Gravity, Urine: 1.015
Urobilinogen, Ur: 0.2
pH, UA: 6 (ref 5.0–8.0)

## 2022-11-12 MED ORDER — FLUTICASONE PROPIONATE 50 MCG/ACT NA SUSP: 2.0000 | Freq: Every day | NASAL | 3 refills | Status: DC

## 2022-11-13 LAB — CBC WITH DIFFERENTIAL/PLATELET
Basophils Absolute: 0.1 10*3/uL (ref 0.0–0.2)
Basos: 2 %
EOS (ABSOLUTE): 0.3 10*3/uL (ref 0.0–0.4)
Eos: 10 %
Hematocrit: 40.2 % (ref 34.0–46.6)
Hemoglobin: 13.2 g/dL (ref 11.1–15.9)
Immature Grans (Abs): 0 10*3/uL (ref 0.0–0.1)
Immature Granulocytes: 0 %
Lymphocytes Absolute: 1.2 10*3/uL (ref 0.7–3.1)
Lymphs: 39 %
MCH: 31.1 pg (ref 26.6–33.0)
MCHC: 32.8 g/dL (ref 31.5–35.7)
MCV: 95 fL (ref 79–97)
Monocytes Absolute: 0.3 10*3/uL (ref 0.1–0.9)
Monocytes: 11 %
Neutrophils Absolute: 1.1 10*3/uL — ABNORMAL LOW (ref 1.4–7.0)
Neutrophils: 38 %
Platelets: 260 10*3/uL (ref 150–450)
RBC: 4.24 x10E6/uL (ref 3.77–5.28)
RDW: 12.4 % (ref 11.7–15.4)
WBC: 3 10*3/uL — ABNORMAL LOW (ref 3.4–10.8)

## 2022-11-13 LAB — CMP14+EGFR
ALT: 21 IU/L (ref 0–32)
AST: 26 IU/L (ref 0–40)
Albumin/Globulin Ratio: 2.4 — ABNORMAL HIGH (ref 1.2–2.2)
Albumin: 4.4 g/dL (ref 3.9–4.9)
Alkaline Phosphatase: 81 IU/L (ref 44–121)
BUN/Creatinine Ratio: 28 (ref 12–28)
BUN: 23 mg/dL (ref 8–27)
Bilirubin Total: 0.3 mg/dL (ref 0.0–1.2)
CO2: 24 mmol/L (ref 20–29)
Calcium: 9.6 mg/dL (ref 8.7–10.3)
Chloride: 107 mmol/L — ABNORMAL HIGH (ref 96–106)
Creatinine, Ser: 0.82 mg/dL (ref 0.57–1.00)
Globulin, Total: 1.8 g/dL (ref 1.5–4.5)
Glucose: 87 mg/dL (ref 70–99)
Potassium: 4.6 mmol/L (ref 3.5–5.2)
Sodium: 144 mmol/L (ref 134–144)
Total Protein: 6.2 g/dL (ref 6.0–8.5)
eGFR: 80 mL/min/{1.73_m2} (ref >59)

## 2022-11-13 LAB — LIPID PANEL
Chol/HDL Ratio: 2.8 ratio (ref 0.0–4.4)
Cholesterol, Total: 221 mg/dL — ABNORMAL HIGH (ref 100–199)
HDL: 78 mg/dL (ref >39)
LDL Chol Calc (NIH): 131 mg/dL — ABNORMAL HIGH (ref 0–99)
Triglycerides: 69 mg/dL (ref 0–149)
VLDL Cholesterol Cal: 12 mg/dL (ref 5–40)

## 2022-11-13 LAB — VITAMIN D 25 HYDROXY (VIT D DEFICIENCY, FRACTURES): Vit D, 25-Hydroxy: 45.7 ng/mL (ref 30.0–100.0)

## 2022-11-13 NOTE — Addendum Note (Signed)
Addended byJoselyn Arrow on: 11/13/2022 10:57 AM   Modules accepted: Orders

## 2022-11-16 ENCOUNTER — Encounter: Payer: Self-pay | Admitting: *Deleted

## 2023-10-27 ENCOUNTER — Ambulatory Visit: Payer: Self-pay

## 2023-10-27 NOTE — Telephone Encounter (Signed)
 Chief Complaint: COVID 19 positive Symptoms: nasal congestion, headache Frequency: x today Pertinent Negatives: Patient denies cough, fever, chest pain, SOB, wheezing, body aches, chills, loss of taste or smell. Disposition: [] ED /[] Urgent Care (no appt availability in office) / [] Appointment(In office/virtual)/ []  [facility]/ [x] Home Care/ [] Refused Recommended Disposition /[] [facility]/ []  Follow-up with PCP Additional Notes: Patient states she has no chronic medical problems (not high risk patient), and tested positive for COVID after her husband tested positive. She states she has mild symptoms with headache and nasal congestion. Patient given home care advice. She states at this time she doesn't think she needs Paxlovid. Advised patient to call back for new or worsening symptoms.  Copied from CRM [id number]. Topic: Clinical - Medical Advice >> Oct 27, 2023  8:33 AM [employer] wrote: Reason for CRM: Patient stated she has covid and would like to know what medications she can take. She tested positive with an at home test.  Her symptoms: headaches, stuffy nose  Callback #: [phone]  Preferred Pharmacy: [facility] - [place], [place] - [address] [place] [place] [place] [address] Phone: [phone] Fax: [phone] Hours: Not open 24 hours Reason for Disposition  [1] COVID-19 diagnosed by positive lab test (e.g., PCR, rapid self-test kit) AND [2] mild symptoms (e.g., cough, fever, others) AND [3] no complications or SOB  Answer Assessment - Initial Assessment Questions 1. COVID-19 DIAGNOSIS: "How do you know that you have COVID?" (e.g., positive lab test or self-test, diagnosed by doctor or NP/PA, symptoms after exposure).     Patient had a positive self home test yesterday.  2. COVID-19 EXPOSURE: "Was there any known exposure to COVID before the symptoms began?" CDC Definition of close contact: within 6 feet (2 meters) for a total  of 15 minutes or more over a 24-hour period.      Yes, her husband tested positive just before she did.  3. ONSET: "When did the COVID-19 symptoms start?"      Today.  4. WORST SYMPTOM: "What is your worst symptom?" (e.g., cough, fever, shortness of breath, muscle aches)     Nasal congestion, headache.  5. COUGH: "Do you have a cough?" If Yes, ask: "How bad is the cough?"       No.  6. FEVER: "Do you have a fever?" If Yes, ask: "What is your temperature, how was it measured, and when did it start?"     No.  7. RESPIRATORY STATUS: "Describe your breathing?" (e.g., normal; shortness of breath, wheezing, unable to speak)      Pretty normal, denies SOB or wheezing.  8. BETTER-SAME-WORSE: "Are you getting better, staying the same or getting worse compared to yesterday?"  If getting worse, ask, "In what way?"     Worse, due to headache.  9. OTHER SYMPTOMS: "Do you have any other symptoms?"  (e.g., chills, fatigue, headache, loss of smell or taste, muscle pain, sore throat)     Denies.  10. HIGH RISK DISEASE: "Do you have any chronic medical problems?" (e.g., asthma, heart or lung disease, weak immune system, obesity, etc.)       No.  11. VACCINE: "Have you had the COVID-19 vaccine?" If Yes, ask: "Which one, how many shots, when did you get it?"       Yes, she thinks it has been over a year since her last shot.  12. PREGNANCY: "Is there any chance you are pregnant?" "When was your last menstrual period?"  N/A.  13. O2 SATURATION MONITOR:  "Do you use an oxygen saturation monitor (pulse oximeter) at home?" If Yes, ask "What is your reading (oxygen level) today?" "What is your usual oxygen saturation reading?" (e.g., 95%)       Yes, she states her reading is 98%.  Protocols used: Coronavirus (COVID-19) Diagnosed or Suspected-A-AH

## 2023-11-16 NOTE — Patient Instructions (Incomplete)
  HEALTH MAINTENANCE RECOMMENDATIONS:  It is recommended that you get at least 30 minutes of aerobic exercise at least 5 days/week (for weight loss, you may need as much as 60-90 minutes). This can be any activity that gets your heart rate up. This can be divided in 10-15 minute intervals if needed, but try and build up your endurance at least once a week.  Weight bearing exercise is also recommended twice weekly.  Eat a healthy diet with lots of vegetables, fruits and fiber.  Colorful foods have a lot of vitamins (ie green vegetables, tomatoes, red peppers, etc).  Limit sweet tea, regular sodas and alcoholic beverages, all of which has a lot of calories and sugar.  Up to 1 alcoholic drink daily may be beneficial for women (unless trying to lose weight, watch sugars).  Drink a lot of water.  Calcium recommendations are 1200-1500 mg daily (1500 mg for postmenopausal women or women without ovaries), and vitamin D  1000 IU daily.  This should be obtained from diet and/or supplements (vitamins), and calcium should not be taken all at once, but in divided doses.  Monthly self breast exams and yearly mammograms for women over the age of 79 is recommended.  Sunscreen of at least SPF 30 should be used on all sun-exposed parts of the skin when outside between the hours of 10 am and 4 pm (not just when at beach or pool, but even with exercise, golf, tennis, and yard work!)  Use a sunscreen that says broad spectrum so it covers both UVA and UVB rays, and make sure to reapply every 1-2 hours.  Remember to change the batteries in your smoke detectors when changing your clock times in the spring and fall. Carbon monoxide detectors are recommended for your home.  Use your seat belt every time you are in a car, and please drive safely and not be distracted with cell phones and texting while driving.  Continue yearly flu shots in the Fall. I recommend getting the updated COVID booster in the Fall, when available  (which will be >3 months from your illness).   Discuss your thyroid  dose and osteopenia with Dr. Balan at your next visit. Higher doses (and goals of lower TSH) are used if trying to suppress nodules.  If that is not the case, perhaps you don't need the same dose (if TSH remains well under 1 on next check).

## 2023-11-16 NOTE — Progress Notes (Unsigned)
 No chief complaint on file.  Morgan Delacruz is a 63 y.o. female who presents for a complete physical.  She had annual GYN exam in 03/2023 with Leighton Punches M.D.   She had COVID-19 infection in 10/2023.  Last year she mentioned some memory concerns.  She had a lot on her plate, caring for her parents (medications, doctor appointments, yardwork) and was unsure if memory was truly any different, but it concerned her more as she sees her mom with some memory issues.  Her husband may mention a conversation they had, or something about a trip they took that she cannot recall.  No daily problems (not losing things, forgetting to pay bills, getting lost, or any other significant issues).  ***changes?  Seasonal allergies:  She uses Flonase  seasonally.  Allergies tend to flare in the Spring. Flonase  is effective, with sudafed as needed.  Last year she had back pain, diagnosed with lumbar radiculopathy and was treated with prednisone .  Xray showed lumbar spondylosis (08/2022). Recurrent probs??? ***  H/o L hip and knee pain:  She saw Dr. Alease Hunter in August through October 2023 for this.  Diagnosed with labrum tear. Saw Dr. Genevive Ket 06/2022, referred her to Dr. Darene Economy for her back. She preferred non-operative management and was doing HEP. She recalls being told she may need a hip replacement within the next 5 years.  Osteopenia:  DEXA's are performed at her GYN. 03/2022 T-2.2 at spine.  Vitamin D  level was a little low at 26.7 in 07/2019, when she had been inconsistent with taking her vitamins.  Last level was normal at 45.7 in 11/2022, when taking MVI daily, and vitamin D  5000 IU 4x/week. She is currently taking ***   H/o elevated lipids in the past. She continues to eat a healthy diet. Occasional red meat. Has some feta on her salads, otherwise not much cheese, not many eggs. Denies any changes to her diet.  Lipids were a little higher last year. Due for recheck.  Component Ref Range & Units (hover) 1 yr  ago 2 yr ago 4 yr ago  Cholesterol, Total 221 High  209 High  197  Triglycerides 69 52 52  HDL 78 84 77  VLDL Cholesterol Cal 12 9 10   LDL Chol Calc (NIH) 131 High  116 High  110 High   Chol/HDL Ratio 2.8 2.5 CM 2.6 CM     Sees Dr. Ronelle Coffee for multinodular goiter.  Last ultrasound was in 01/2022. Nodules were stable. No further f/u recommended. She continues on thyroid  medication, 88 mcg, monitored/prescribed by Dr. Ronelle Coffee.   Denies changes to hair/skin/bowels.  Some constipation (relates to her calcium pill). Last TSH 03/2023 with Dr. Ronelle Coffee, 0.698.   Immunization History  Administered Date(s) Administered   Hepatitis A, Adult 11/25/2016, 07/20/2019   Influenza,inj,quad, With Preservative 01/06/2021   Influenza-Unspecified 03/07/2020, 03/08/2022   Moderna Covid-19 Vaccine Bivalent Booster 80yrs & up 05/06/2021   Moderna Sars-Covid-2 Vaccination 08/18/2019, 09/18/2019, 05/11/2020   Pfizer(Comirnaty)Fall Seasonal Vaccine 12 years and older 11/12/2022   Tdap 11/25/2016   Zoster Recombinant(Shingrix) 04/03/2020, 06/05/2020  Got flu shot at Dr. Earleen Glazier office in 03/2023 Last Pap smear: [facility], 11/2022, normal Last mammogram: 03/2022, at GYN office Last colonoscopy: 04/2020, Dr. Andriette Keeling.  Diverticulosis, internal hemorrhoids Last DEXA:  03/2022 T-2.2 at spine (GYN). Dentist: twice a year Ophtho: every other year Exercise:   Exercise classes with weights 3-4x/week.  Exercises 45 minutes of exercise 4-5 days/week (tracks on her watch). Walks frequently. Does yardwork for  parents, lifts heavy items, push-mows.  Sees dermatologist yearly.   PMH, PSH, SH and FH were reviewed and updated.     ROS:  The patient denies anorexia, fever, vision changes, decreased hearing, ear pain, sore throat, breast concerns, chest pain, palpitations, dizziness, syncope, dyspnea on exertion, cough, swelling, nausea, vomiting, diarrhea, constipation, abdominal pain, melena, hematochezia,  indigestion/heartburn, hematuria, incontinence, dysuria, vaginal bleeding, discharge, odor or itch, genital lesions, joint pains, numbness, tingling, weakness, tremor, suspicious skin lesions, depression, abnormal bleeding, or enlarged lymph nodes. Some bruising.  Moods are good.   Denies any insomnia. Memory Allergies are controlled with Flonase  and sudafed prn. LBP resolved, occurs periodically after yardwork. Left hip pain per HPI.  No longer having L knee pain.   PHYSICAL EXAM:  LMP 08/24/2011   Wt Readings from Last 3 Encounters:  11/12/22 128 lb (58.1 kg)  08/14/22 129 lb (58.5 kg)  07/15/22 131 lb 9.6 oz (59.7 kg)    General Appearance:    Alert, cooperative, no distress, appears stated age  Head:    Normocephalic, without obvious abnormality, atraumatic  Eyes:    PERRL, conjunctiva/corneas clear, EOM's intact, fundi    benign  Ears:    Normal TM's and external ear canals  Nose:   Normal, no drainage  Throat:   Normal mucosa, no lesions  Neck:   Supple, no lymphadenopathy;  thyroid :  No enlargement/ tenderness.  Nodule palpable on R lobe of thyroid , nontender.  no carotid bruit or JVD  Back:    Spine nontender, no curvature, ROM normal, no CVA     tenderness  Lungs:     Clear to auscultation bilaterally without wheezes, rales or     ronchi; respirations unlabored  Chest Wall:    No tenderness or deformity   Heart:    Regular rate and rhythm, S1 and S2 normal, no murmur, rub   or gallop  Breast Exam:    Deferred to GYN  Abdomen:     Soft, non-tender, nondistended, normoactive bowel sounds,    no masses, no hepatosplenomegaly  Genitalia:    Deferred to GYN     Extremities:   No clubbing, cyanosis or edema.   Pulses:   2+ and symmetric all extremities  Skin:   Skin texture, turgor normal, no rashes or lesions (limited exam, didn't change into gown).   Lymph nodes:   Cervical, supraclavicular nodes normal  Neurologic:   Alert and oriented. Normal strength, sensation and  gait; reflexes 2+ and symmetric throughout                               Psych:   Normal mood, affect, hygiene and grooming.     ***UPDATE THYROID , R nodule   ASSESSMENT/PLAN:  Prevnar-20  Lipids, c-met, Cbc (--never returned for recheck after low WBC last year) Vit D if inconsistent in taking supplement   Abstract flu shot--got at Dr. Earleen Glazier office, in the 04/08/23 note Did she have mammo at GYN visit?  We got pap result, no notes or mammo  Discussed monthly self breast exams and yearly mammograms; at least 30 minutes of aerobic activity at least 5 days/week, weight-bearing exercise at least 2x.wk; proper sunscreen use reviewed; healthy diet, including goals of calcium and vitamin D  intake and alcohol recommendations (less than or equal to 1 drink/day) reviewed; regular seatbelt use; changing batteries in smoke detectors.  Immunization recommendations discussed--continue yearly flu shots.  Prevnar-20 *** COVID boosters  discussed--just had illness.  Recommend booster when updated in the Fall. Colonoscopy recommendations reviewed, UTD  F/u 1 year, sooner prn.

## 2023-11-17 ENCOUNTER — Encounter: Payer: Self-pay | Admitting: *Deleted

## 2023-11-18 ENCOUNTER — Encounter: Payer: Self-pay | Admitting: Family Medicine

## 2023-11-18 ENCOUNTER — Ambulatory Visit: Payer: BC Managed Care – PPO | Admitting: Family Medicine

## 2023-11-18 VITALS — BP 110/68 | HR 68 | Ht 65.0 in | Wt 126.2 lb

## 2023-11-18 DIAGNOSIS — E559 Vitamin D deficiency, unspecified: Secondary | ICD-10-CM | POA: Diagnosis not present

## 2023-11-18 DIAGNOSIS — E042 Nontoxic multinodular goiter: Secondary | ICD-10-CM

## 2023-11-18 DIAGNOSIS — Z23 Encounter for immunization: Secondary | ICD-10-CM

## 2023-11-18 DIAGNOSIS — Z0000 Encounter for general adult medical examination without abnormal findings: Secondary | ICD-10-CM

## 2023-11-18 DIAGNOSIS — E039 Hypothyroidism, unspecified: Secondary | ICD-10-CM

## 2023-11-18 DIAGNOSIS — M8588 Other specified disorders of bone density and structure, other site: Secondary | ICD-10-CM

## 2023-11-18 DIAGNOSIS — J309 Allergic rhinitis, unspecified: Secondary | ICD-10-CM

## 2023-11-18 LAB — CBC WITH DIFFERENTIAL/PLATELET

## 2023-11-19 LAB — COMPREHENSIVE METABOLIC PANEL WITH GFR
ALT: 9 IU/L (ref 0–32)
AST: 23 IU/L (ref 0–40)
Albumin: 4.5 g/dL (ref 3.9–4.9)
Alkaline Phosphatase: 80 IU/L (ref 44–121)
BUN/Creatinine Ratio: 21 (ref 12–28)
BUN: 17 mg/dL (ref 8–27)
Bilirubin Total: 0.4 mg/dL (ref 0.0–1.2)
CO2: 21 mmol/L (ref 20–29)
Calcium: 9.5 mg/dL (ref 8.7–10.3)
Chloride: 105 mmol/L (ref 96–106)
Creatinine, Ser: 0.8 mg/dL (ref 0.57–1.00)
Globulin, Total: 1.8 g/dL (ref 1.5–4.5)
Glucose: 77 mg/dL (ref 70–99)
Potassium: 4.2 mmol/L (ref 3.5–5.2)
Sodium: 142 mmol/L (ref 134–144)
Total Protein: 6.3 g/dL (ref 6.0–8.5)
eGFR: 82 mL/min/{1.73_m2} (ref >59)

## 2023-11-19 LAB — LIPID PANEL
Chol/HDL Ratio: 2.5 ratio (ref 0.0–4.4)
Cholesterol, Total: 206 mg/dL — ABNORMAL HIGH (ref 100–199)
HDL: 81 mg/dL (ref >39)
LDL Chol Calc (NIH): 114 mg/dL — ABNORMAL HIGH (ref 0–99)
Triglycerides: 63 mg/dL (ref 0–149)
VLDL Cholesterol Cal: 11 mg/dL (ref 5–40)

## 2023-11-19 LAB — CBC WITH DIFFERENTIAL/PLATELET
Basophils Absolute: 0.1 10*3/uL (ref 0.0–0.2)
Basos: 2 %
EOS (ABSOLUTE): 0.4 10*3/uL (ref 0.0–0.4)
Eos: 10 %
Hematocrit: 39.8 % (ref 34.0–46.6)
Hemoglobin: 12.9 g/dL (ref 11.1–15.9)
Immature Grans (Abs): 0 10*3/uL (ref 0.0–0.1)
Immature Granulocytes: 0 %
Lymphocytes Absolute: 1.2 10*3/uL (ref 0.7–3.1)
Lymphs: 32 %
MCH: 32.9 pg (ref 26.6–33.0)
MCHC: 32.4 g/dL (ref 31.5–35.7)
MCV: 102 fL — ABNORMAL HIGH (ref 79–97)
Monocytes Absolute: 0.3 10*3/uL (ref 0.1–0.9)
Monocytes: 8 %
Neutrophils Absolute: 1.7 10*3/uL (ref 1.4–7.0)
Neutrophils: 48 %
Platelets: 306 10*3/uL (ref 150–450)
RBC: 3.92 x10E6/uL (ref 3.77–5.28)
RDW: 11.7 % (ref 11.7–15.4)
WBC: 3.7 10*3/uL (ref 3.4–10.8)

## 2023-11-20 ENCOUNTER — Ambulatory Visit: Payer: Self-pay | Admitting: Family Medicine

## 2024-02-08 ENCOUNTER — Encounter: Payer: Self-pay | Admitting: Sports Medicine

## 2024-02-17 ENCOUNTER — Other Ambulatory Visit: Payer: Self-pay | Admitting: Family Medicine

## 2024-02-17 DIAGNOSIS — J309 Allergic rhinitis, unspecified: Secondary | ICD-10-CM

## 2024-02-21 ENCOUNTER — Other Ambulatory Visit: Payer: Self-pay | Admitting: Medical Genetics

## 2024-02-29 ENCOUNTER — Other Ambulatory Visit

## 2024-03-06 ENCOUNTER — Encounter: Payer: Self-pay | Admitting: Endocrinology

## 2024-03-06 ENCOUNTER — Other Ambulatory Visit: Payer: Self-pay | Admitting: Endocrinology

## 2024-03-06 DIAGNOSIS — E049 Nontoxic goiter, unspecified: Secondary | ICD-10-CM

## 2024-03-09 ENCOUNTER — Ambulatory Visit
Admission: RE | Admit: 2024-03-09 | Discharge: 2024-03-09 | Disposition: A | Discharge status: Home / Self Care | Source: Ambulatory Visit | Attending: Endocrinology | Admitting: Endocrinology

## 2024-03-09 DIAGNOSIS — E049 Nontoxic goiter, unspecified: Secondary | ICD-10-CM

## 2024-03-16 ENCOUNTER — Other Ambulatory Visit

## 2024-03-16 DIAGNOSIS — Z006 Encounter for examination for normal comparison and control in clinical research program: Secondary | ICD-10-CM

## 2024-03-25 LAB — GENECONNECT MOLECULAR SCREEN: Genetic Analysis Overall Interpretation: NEGATIVE

## 2024-04-27 LAB — HM DEXA SCAN

## 2024-05-25 NOTE — Progress Notes (Unsigned)
° °  LILLETTE Ileana Collet, PhD, LAT, ATC acting as a scribe for Artist Lloyd, MD.  YESSENIA MAILLET is a 63 y.o. female who presents to [facility] today for osteoporosis management. Family hx of osteoporosis. Family hx of breast cancer.  DEXA scan (date, T-score): 04/27/24: Spine= -3.2, L-FN= -1.2, R-FN= -0.9 Prior treatment: *** History of Hip, Spine, or Wrist Fx: *** Heart disease or stroke: *** Cancer: none Kidney Disease: *** Gastric/Peptic Ulcer: *** Gastric bypass surgery: *** Severe GERD: *** Hx of seizures: *** Age at Menopause: *** Calcium intake: *** Vitamin D  intake: *** Hormone replacement therapy: *** Smoking history: never smoked Alcohol: *** Exercise: yes*** Major dental work in past year: *** Parents with hip/spine fracture: *** Height loss: ***   Pertinent review of systems: ***  Relevant historical information: ***   Exam:  LMP 08/24/2011  General: Well Developed, well nourished, and in no acute distress.   MSK: ***    Lab and Radiology Results No results found for this or any previous visit (from the past 72 hours). No results found.     Assessment and Plan: 63 y.o. female with ***   PDMP not reviewed this encounter. No orders of the defined types were placed in this encounter.  No orders of the defined types were placed in this encounter.    Discussed warning signs or symptoms. Please see discharge instructions. Patient expresses understanding.   ***

## 2024-05-30 ENCOUNTER — Telehealth: Payer: Self-pay

## 2024-05-30 ENCOUNTER — Encounter: Payer: Self-pay | Admitting: Family Medicine

## 2024-05-30 ENCOUNTER — Ambulatory Visit: Admitting: Family Medicine

## 2024-05-30 VITALS — BP 118/80 | HR 74 | Ht 65.0 in | Wt 128.0 lb

## 2024-05-30 DIAGNOSIS — M810 Age-related osteoporosis without current pathological fracture: Secondary | ICD-10-CM

## 2024-05-30 LAB — COMPREHENSIVE METABOLIC PANEL WITH GFR
ALT: 13 U/L (ref 3–35)
AST: 23 U/L (ref 5–37)
Albumin: 4.3 g/dL (ref 3.5–5.2)
Alkaline Phosphatase: 63 U/L (ref 39–117)
BUN: 18 mg/dL (ref 6–23)
CO2: 30 meq/L (ref 19–32)
Calcium: 9.9 mg/dL (ref 8.4–10.5)
Chloride: 104 meq/L (ref 96–112)
Creatinine, Ser: 0.75 mg/dL (ref 0.40–1.20)
GFR: 84.05 mL/min
Glucose, Bld: 84 mg/dL (ref 70–99)
Potassium: 3.9 meq/L (ref 3.5–5.1)
Sodium: 142 meq/L (ref 135–145)
Total Bilirubin: 0.6 mg/dL (ref 0.2–1.2)
Total Protein: 6.5 g/dL (ref 6.0–8.3)

## 2024-05-30 LAB — VITAMIN D 25 HYDROXY (VIT D DEFICIENCY, FRACTURES): VITD: 53.27 ng/mL (ref 30.00–100.00)

## 2024-05-30 NOTE — Telephone Encounter (Signed)
 Prior Authorization for [facility] initiated via [employer] Case Id [id number]  [facility] [place] [address] [place], [place] [address]  Has the patient failed prior treatment with or is intolerant to previous injectable osteoporosis therapy (e.g., zoledronic acid [Reclast], a denosumab product [Prolia, Jubbonti, Ospomyv, Stoboclo], teriparatide [Forteo])?

## 2024-05-30 NOTE — Patient Instructions (Addendum)
 Thank you for coming in today.   Please get labs today before you leave   Plan for Tymlos

## 2024-05-30 NOTE — Telephone Encounter (Signed)
 Plan to start pt on tymlos. Please check for prior auth?

## 2024-05-31 ENCOUNTER — Ambulatory Visit: Payer: Self-pay | Admitting: Family Medicine

## 2024-05-31 NOTE — Telephone Encounter (Signed)
 Prior Authorization for [employer] DENIED  Waiting for most recent DEXA scan to be scanned to chart, them will submit appeal.

## 2024-05-31 NOTE — Progress Notes (Signed)
 Vitamin D  and metabolic panel and calcium levels look okay.  Continue current dose of vitamin D .

## 2024-06-06 NOTE — Telephone Encounter (Signed)
 Appeal documentation placed at the front desk for faxing.

## 2024-06-07 NOTE — Telephone Encounter (Signed)
 Appeal documentation has been faxed.

## 2024-06-13 ENCOUNTER — Telehealth: Payer: Self-pay | Admitting: *Deleted

## 2024-06-13 NOTE — Telephone Encounter (Signed)
 Copied from CRM [id number]. Topic: Clinical - Medication Question >> Jun 13, 2024  2:37 PM Amy B wrote: Reason for CRM: Patient requests a call back to discuss a new medication she was prescribe, Evenity, for osteoporosis.  Please call [phone]  Patient had DEXA done by GYN and was referred to Dr. Joane. He rec Educational Psychologist and she she would like to discuss with Dr. Randol prior to considering.

## 2024-06-14 ENCOUNTER — Telehealth: Payer: Self-pay | Admitting: Family Medicine

## 2024-06-14 NOTE — Telephone Encounter (Signed)
 Patient called and stated that evenity could not be approved because there is not enough information provided in reference to her bone density scan, medical record for fragility, etc. Please call patient in regards to everything insurance needed.  Please advise.

## 2024-06-15 NOTE — Telephone Encounter (Signed)
 Morgan Delacruz  was recommended for pt. Appeal has been faxed, see previous telephone encounter.

## 2024-06-15 NOTE — Telephone Encounter (Signed)
 Morgan Delacruz   06/14/24  3:24 PM Note Patient called and stated that evenity could not be approved because there is not enough information provided in reference to her bone density scan, medical record for fragility, etc. Please call patient in regards to everything insurance needed.  Please advise.

## 2024-06-21 MED ORDER — SHARPS CONTAINER MISC: 1.0000 | 3 refills | Status: AC | PRN

## 2024-06-21 MED ORDER — TYMLOS 3120 MCG/1.56ML ~~LOC~~ SOPN: 80.0000 ug | PEN_INJECTOR | Freq: Every day | SUBCUTANEOUS | 11 refills | Status: AC

## 2024-06-21 MED ORDER — PEN NEEDLES 31G X 5 MM MISC: 1.0000 | Freq: Every day | 3 refills | Status: AC

## 2024-06-21 MED ORDER — WEBCOL ALCOHOL PREP MEDIUM 70 % PADS: 1.0000 | MEDICATED_PAD | Freq: Every day | 3 refills | Status: AC

## 2024-06-21 NOTE — Telephone Encounter (Signed)
 Appeal decision - denial has been overturned.   Prior Authorization for TYMLOS  APPROVED Case # [id number] Valid: 06/06/24-06/06/25  Letter sent to scan.

## 2024-06-21 NOTE — Addendum Note (Signed)
 Addended by: MARDY LEOTIS RAMAN on: 06/21/2024 11:29 AM   Modules accepted: Orders

## 2024-06-21 NOTE — Addendum Note (Signed)
 Addended by: MARDY LEOTIS RAMAN on: 06/21/2024 09:50 AM   Modules accepted: Orders

## 2024-06-21 NOTE — Progress Notes (Signed)
 Chief Complaint  Patient presents with   Consult    Discuss osteoporosis med options.    Patient present to discuss osteoporosis treatment. DEXA was ordered by GYN, done in 04/2024. It showed T-3.2 at the spine. Her GYN referred her to Dr. Joane, who recommended starting Tymlos  at visit last month (to use for 1-2 years). Vitamin D  level and c-met were normal last month.  Tymlos  was initially denied, but later was approved (06/21/24).  Patient presents to discuss treatment options for osteoporosis, with questions about Evenity and Tymlos .  She has been trying to do everything she can on her own to help her bones. She takes a calcium supplement daily, and 5000 IU of D3. She eats a lot of spinach and kale. She recently started drinking almond milk, 1 cup daily (no milk prior, other than just in coffee). She has greek yogurt most days. She goes to the gym.   PMH, PSH, SH reviewed  Outpatient Encounter Medications as of 06/22/2024  Medication Sig Note   Calcium Carbonate (CALCIUM 500 PO) Take 1 tablet by mouth daily.    Cholecalciferol (VITAMIN D ) 125 MCG (5000 UT) CAPS Take by mouth.    fluticasone  (FLONASE ) 50 MCG/ACT nasal spray PLACE 2 SPRAYS INTO BOTH NOSTRILS DAILY.    levothyroxine (SYNTHROID, LEVOTHROID) 88 MCG tablet Take 88 mcg by mouth daily before breakfast.     Multiple Vitamins-Minerals (MULTIVITAMIN ADULTS 50+ PO) Take 1 tablet by mouth daily.    [DISCONTINUED] calcium-vitamin D  (OSCAL WITH D) 500-200 MG-UNIT tablet Take 1 tablet by mouth daily. 11/12/2022: Believes she is taking a calcium tablet only (no D); takes about 4 days/week   Abaloparatide  (TYMLOS ) 3120 MCG/1.56ML SOPN Inject 80 mcg into the skin daily. (Patient not taking: Reported on 06/22/2024)    Alcohol  Swabs (WEBCOL ALCOHOL  PREP MEDIUM) 70 % PADS 1 each by Does not apply route daily. (Patient not taking: Reported on 06/22/2024)    Insulin Pen Needle (PEN NEEDLES) 31G X 5 MM MISC 1 Needle by Does not apply route  daily. Use with Tymlos  (Patient not taking: Reported on 06/22/2024)    sharps container 1 each by Does not apply route as needed. (Patient not taking: Reported on 06/22/2024)    No facility-administered encounter medications on file as of 06/22/2024.   Allergies  Allergen Reactions   Codeine Other (See Comments)    Heart racing/palpitations.    ROS: no f/c, URI symptoms. No pain, GI complaints or other concerns.    PHYSICAL EXAM:  BP 112/64   Pulse 72   Ht 5' 5 (1.651 m)   Wt 126 lb (57.2 kg)   LMP 08/24/2011   BMI 20.97 kg/m   Wt Readings from Last 3 Encounters:  06/22/24 126 lb (57.2 kg)  05/30/24 128 lb (58.1 kg)  11/18/23 126 lb 3.2 oz (57.2 kg)    Pleasant, well-appearing female, in good spirits. She is alert and oriented, cranial nerves grossly intact, normal gait. Normal mood, affect, hygiene and grooming    ASSESSMENT/PLAN:  Age-related osteoporosis without current pathological fracture - Discussed Evenity, Tymlos , Prolia and bisphosphonates in detail.  Risks/benefits/SE, duration. She prefers Evenity (monthly injections), if covered  Evenity and Tymlos  are both anabolic osteoporosis medications (bone-building).  Evenity--2 SQ injections once a month (at a clinic) for 12 months Monoclonal Ab that blocks sclerostin, increasing bone formation and decreasing bone breakdown. (Dual action) Must be followed by either Prolia or bisphosphonate after the 12 months are completed. Black box warning, cardiovascular risks. Not  recommended for patients with increased risk for MI, stroke. SE joint pain, headaches, weakness.  Tymlos --self administered SQ injection, daily for up to 2 years. Parathyroid hormone-related peptide analog, increases bone formation. (Single action) Might be better at reducing spinal fractures than Evenity Potential risk of bone cancer (not confirmed in human studies) SE nausea dizziness, headaches, aches/pains   She prefers Evenity d/t being  once a month, not self-administered. Aware that tymlos  might be better at reducing spinal fractures. She will check with Dr. Virgilio office about this medication (will need to see if it is covered). Discussed the need for ongoing antiresorptive med after completion of bone building med.  We reviewed Ca, D and weight-bearing exercise in detail. All questions were answered.  I spent 33 minutes dedicated to the care of this patient, including pre-visit review of records, face to face time, post-visit ordering of testing and documentation.

## 2024-06-22 ENCOUNTER — Ambulatory Visit: Admitting: Family Medicine

## 2024-06-22 VITALS — BP 112/64 | HR 72 | Ht 65.0 in | Wt 126.0 lb

## 2024-06-22 DIAGNOSIS — M810 Age-related osteoporosis without current pathological fracture: Secondary | ICD-10-CM

## 2024-06-22 NOTE — Patient Instructions (Signed)
 Evenity and Tymlos  are both anabolic osteoporosis medications (bone-building).  Evenity--2 SQ injections once a month (at a clinic) for 12 months Monoclonal Ab that blocks sclerostin, increasing bone formation and decreasing bone breakdown. (Dual action) Must be followed by either Prolia or bisphosphonate after the 12 months are completed. Black box warning, cardiovascular risks. Not recommended for patients with increased risk for MI, stroke. SE joint pain, headaches, weakness.  Tymlos --self administered SQ injection, daily for up to 2 years. Parathyroid hormone-related peptide analog, increases bone formation. (Single action) Might be better at reducing spinal fractures than Evenity Potential risk of bone cancer (not confirmed in human studies) SE nausea dizziness, headaches, aches/pains  After these (one or the other), next step is to continue with anti-resorptive medications (either Prolia injections every 6 months, longterm, or a bisphosphonate--fosamax, boniva, actonel, reclast). If the injections are either not tolerated or not affordable, either of these options would be recommended to treat your osteoporosis.

## 2024-07-11 ENCOUNTER — Telehealth: Payer: Self-pay | Admitting: Family Medicine

## 2024-07-11 NOTE — Telephone Encounter (Signed)
 Patient called and stated that her Tymlos  will be delivered next Wednesday. She is going to start injections and she wondered if we do any sort of teaching for how to do the injections. Please advise.

## 2024-07-13 NOTE — Telephone Encounter (Signed)
 OK to schedule NUR visit for injection teaching. Pt should bring medication with her to the visit.

## 2024-07-13 NOTE — Telephone Encounter (Signed)
 Scheduled

## 2024-07-20 ENCOUNTER — Ambulatory Visit

## 2024-12-07 ENCOUNTER — Encounter: Payer: Self-pay | Admitting: Family Medicine
